# Patient Record
Sex: Female | Born: 1960 | Hispanic: Yes | Marital: Married | Smoking: Former smoker
Health system: Southern US, Community
[De-identification: ages and names within clinical notes are randomized; demographics above are authoritative.]

## PROBLEM LIST (undated history)

## (undated) DIAGNOSIS — K219 Gastro-esophageal reflux disease without esophagitis: Secondary | ICD-10-CM

## (undated) DIAGNOSIS — T7840XA Allergy, unspecified, initial encounter: Secondary | ICD-10-CM

## (undated) DIAGNOSIS — E079 Disorder of thyroid, unspecified: Secondary | ICD-10-CM

## (undated) DIAGNOSIS — E785 Hyperlipidemia, unspecified: Secondary | ICD-10-CM

## (undated) DIAGNOSIS — E119 Type 2 diabetes mellitus without complications: Secondary | ICD-10-CM

## (undated) HISTORY — DX: Hyperlipidemia, unspecified: E78.5

## (undated) HISTORY — PX: FOOT SURGERY: SHX648

## (undated) HISTORY — DX: Allergy, unspecified, initial encounter: T78.40XA

## (undated) HISTORY — DX: Type 2 diabetes mellitus without complications: E11.9

## (undated) HISTORY — PX: REDUCTION MAMMAPLASTY: SUR839

## (undated) HISTORY — PX: SHOULDER SURGERY: SHX246

## (undated) HISTORY — DX: Gastro-esophageal reflux disease without esophagitis: K21.9

---

## 2008-12-20 ENCOUNTER — Encounter: Admission: RE | Admit: 2008-12-20 | Discharge: 2008-12-20 | Payer: Self-pay | Admitting: Family Medicine

## 2010-09-13 ENCOUNTER — Other Ambulatory Visit: Payer: Self-pay | Admitting: Orthopedic Surgery

## 2010-09-13 ENCOUNTER — Encounter (HOSPITAL_COMMUNITY): Payer: Managed Care, Other (non HMO)

## 2010-09-13 LAB — COMPREHENSIVE METABOLIC PANEL
AST: 17 U/L (ref 0–37)
Albumin: 3.9 g/dL (ref 3.5–5.2)
Alkaline Phosphatase: 91 U/L (ref 39–117)
BUN: 16 mg/dL (ref 6–23)
Potassium: 3.7 mEq/L (ref 3.5–5.1)
Sodium: 140 mEq/L (ref 135–145)
Total Protein: 7.5 g/dL (ref 6.0–8.3)

## 2010-09-13 LAB — URINALYSIS, ROUTINE W REFLEX MICROSCOPIC
Leukocytes, UA: NEGATIVE
Nitrite: NEGATIVE
Specific Gravity, Urine: 1.025 (ref 1.005–1.030)
Urobilinogen, UA: 1 mg/dL (ref 0.0–1.0)
pH: 6.5 (ref 5.0–8.0)

## 2010-09-13 LAB — DIFFERENTIAL
Eosinophils Absolute: 0.1 10*3/uL (ref 0.0–0.7)
Lymphocytes Relative: 31 % (ref 12–46)
Lymphs Abs: 1.8 10*3/uL (ref 0.7–4.0)
Neutro Abs: 3.4 10*3/uL (ref 1.7–7.7)
Neutrophils Relative %: 60 % (ref 43–77)

## 2010-09-13 LAB — SURGICAL PCR SCREEN: MRSA, PCR: NEGATIVE

## 2010-09-13 LAB — APTT: aPTT: 31 seconds (ref 24–37)

## 2010-09-13 LAB — CBC
Hemoglobin: 12.8 g/dL (ref 12.0–15.0)
Platelets: 291 10*3/uL (ref 150–400)
RBC: 4.56 MIL/uL (ref 3.87–5.11)
WBC: 5.7 10*3/uL (ref 4.0–10.5)

## 2010-09-13 LAB — PROTIME-INR
INR: 1.06 (ref 0.00–1.49)
Prothrombin Time: 14 seconds (ref 11.6–15.2)

## 2010-09-20 ENCOUNTER — Ambulatory Visit (HOSPITAL_COMMUNITY)
Admission: RE | Admit: 2010-09-20 | Discharge: 2010-09-21 | Disposition: A | Discharge status: Home / Self Care | Payer: Managed Care, Other (non HMO) | Source: Ambulatory Visit | Attending: Orthopedic Surgery | Admitting: Orthopedic Surgery

## 2010-09-20 DIAGNOSIS — M25519 Pain in unspecified shoulder: Secondary | ICD-10-CM | POA: Insufficient documentation

## 2010-09-20 DIAGNOSIS — Z01812 Encounter for preprocedural laboratory examination: Secondary | ICD-10-CM | POA: Insufficient documentation

## 2010-09-20 DIAGNOSIS — M25819 Other specified joint disorders, unspecified shoulder: Secondary | ICD-10-CM | POA: Insufficient documentation

## 2010-09-20 DIAGNOSIS — S43429A Sprain of unspecified rotator cuff capsule, initial encounter: Secondary | ICD-10-CM | POA: Insufficient documentation

## 2010-09-20 DIAGNOSIS — E039 Hypothyroidism, unspecified: Secondary | ICD-10-CM | POA: Insufficient documentation

## 2010-09-20 DIAGNOSIS — X58XXXA Exposure to other specified factors, initial encounter: Secondary | ICD-10-CM | POA: Insufficient documentation

## 2010-09-20 DIAGNOSIS — Z79899 Other long term (current) drug therapy: Secondary | ICD-10-CM | POA: Insufficient documentation

## 2010-09-24 NOTE — Discharge Summary (Signed)
  NAMEHARUNA, [name] NO.:  [id number]  MEDICAL RECORD NO.:  [id number]  LOCATION:  1605                         FACILITY:  [facility]  PHYSICIAN:  Georges Lynch. Moshe Wenger, M.D.DATE OF BIRTH:  04/30/1961  DATE OF ADMISSION:  09/20/2010 DATE OF DISCHARGE:  09/21/2010                              DISCHARGE SUMMARY   She was taken to surgery by me on September 20, 2010.  I repaired a complete complex tear of the right rotator cuff.  Postop, she did well.  I saw her today for followup.  On September 21, 2010, she was afebrile.  She had good circulation and had no specific problems.  Her laboratory studies on admission, her screening of Staph test was negative.  White count 5700, hemoglobin 12.8, hematocrit 39, and her platelets were 291,000. The C-metabolic was normal except for the glucose was slightly elevated at 102.  The INR of 1.06, PT was 14, and PTT was 31.  Urinalysis was negative.  DISCHARGE CONDITION:  Improved.  DISCHARGE MEDICINES:  On the discharge manager.  DISCHARGE DIET:  Regular diet.  DISCHARGE INSTRUCTIONS: 1. To see me in the office in 2 weeks or prior to that if there is any     problem. 2. She will keep her arm in a sling as I explained. 3. She can change her dressings daily.          ______________________________ Georges Lynch Darrelyn Hillock, M.D.     RAG/MEDQ  D:  09/21/2010  T:  09/21/2010  Job:  [id number]  Electronically Signed by Ranee Gosselin M.D. on 09/24/2010 08:25:32 AM

## 2010-09-24 NOTE — Op Note (Signed)
  NAMEDACEY, MILBERGER NO.:  [id number]  MEDICAL RECORD NO.:  [id number]  LOCATION:  1605                         FACILITY:  [facility]  PHYSICIAN:  Georges Lynch. Shamara Soza, M.D.DATE OF BIRTH:  04/30/1961  DATE OF PROCEDURE:  09/20/2010 DATE OF DISCHARGE:                              OPERATIVE REPORT   SURGEON:  Georges Lynch. Darrelyn Hillock, MD  ASSISTANT:  Jaquelyn Bitter. Chabon, PA  PREOPERATIVE DIAGNOSES: 1. Complex retracted tear of the right rotator cuff. 2. Impingement syndrome, right shoulder.  POSTOPERATIVE DIAGNOSES: 1. Complex retracted tear of the right rotator cuff. 2. Impingement syndrome, right shoulder.  OPERATION: 1. Open acromionectomy on the right shoulder. 2. Repair of a complex retracted tear of the rotator cuff, right     shoulder.  PROCEDURE:  Under general anesthesia, routine orthopedic prepping and draping was carried out.  The patient had 1 g of IV Ancef preop.  The appropriate time-out was carried out before an incision was made. Prior to that the patient's appropriate right arm was marked in the holding area.  She did have an interscalene block prior to bringing her back to surgery.  After the patient has placed in the beach-chair position, sterile prepping and draping of right shoulder was carried out.  As I mentioned, she had the appropriate time-out.  Incision was made over the anterior aspect of the right shoulder, bleeders identified, and cauterized.  At this time, identified the acromion and stripped the deltoid tendon from the acromion in usual fashion.  I then did a partial acromionectomy.  At that time, we had nice re-establishment of the subacromial space.  I excised the subdeltoid bursa and inspected the tendon, she had a retracted tear of the rotator cuff, as a complex tear. I thoroughly irrigated out the area.  I burred the lateral articular surface of the humerus and then utilized 2 Stryker anchors and then sutured the tendon down in  place.  I thoroughly irrigated out the area, no graft was used.  I then reapproximated deltoid tendon and muscle in usual fashion.  Subcutaneous was closed with 0 Vicryl and skin with metal staples.  Sterile Neosporin dressing was applied.  The patient was placed in a shoulder immobilizer.         ______________________________ Georges Lynch Darrelyn Hillock, M.D.    RAG/MEDQ  D:  09/20/2010  T:  09/21/2010  Job:  [id number]  Electronically Signed by Ranee Gosselin M.D. on 09/24/2010 08:25:30 AM

## 2011-02-07 ENCOUNTER — Other Ambulatory Visit: Payer: Self-pay | Admitting: Family Medicine

## 2011-02-07 DIAGNOSIS — Z1231 Encounter for screening mammogram for malignant neoplasm of breast: Secondary | ICD-10-CM

## 2011-02-25 ENCOUNTER — Ambulatory Visit
Admission: RE | Admit: 2011-02-25 | Discharge: 2011-02-25 | Disposition: A | Discharge status: Home / Self Care | Payer: Managed Care, Other (non HMO) | Source: Ambulatory Visit | Attending: Family Medicine | Admitting: Family Medicine

## 2011-02-25 DIAGNOSIS — Z1231 Encounter for screening mammogram for malignant neoplasm of breast: Secondary | ICD-10-CM

## 2012-09-27 ENCOUNTER — Emergency Department (HOSPITAL_COMMUNITY)
Admission: EM | Admit: 2012-09-27 | Discharge: 2012-09-27 | Disposition: A | Discharge status: Home / Self Care | Payer: BC Managed Care – PPO | Attending: Emergency Medicine | Admitting: Emergency Medicine

## 2012-09-27 ENCOUNTER — Emergency Department (HOSPITAL_COMMUNITY): Payer: BC Managed Care – PPO

## 2012-09-27 ENCOUNTER — Encounter (HOSPITAL_COMMUNITY): Payer: Self-pay | Admitting: *Deleted

## 2012-09-27 DIAGNOSIS — R112 Nausea with vomiting, unspecified: Secondary | ICD-10-CM | POA: Insufficient documentation

## 2012-09-27 DIAGNOSIS — Z79899 Other long term (current) drug therapy: Secondary | ICD-10-CM | POA: Insufficient documentation

## 2012-09-27 DIAGNOSIS — R0789 Other chest pain: Secondary | ICD-10-CM | POA: Insufficient documentation

## 2012-09-27 DIAGNOSIS — R209 Unspecified disturbances of skin sensation: Secondary | ICD-10-CM | POA: Insufficient documentation

## 2012-09-27 DIAGNOSIS — E079 Disorder of thyroid, unspecified: Secondary | ICD-10-CM | POA: Insufficient documentation

## 2012-09-27 HISTORY — DX: Disorder of thyroid, unspecified: E07.9

## 2012-09-27 LAB — CBC
HCT: 42.7 % (ref 36.0–46.0)
Hemoglobin: 14.6 g/dL (ref 12.0–15.0)
MCH: 29.2 pg (ref 26.0–34.0)
RBC: 5 MIL/uL (ref 3.87–5.11)

## 2012-09-27 LAB — BASIC METABOLIC PANEL
CO2: 29 mEq/L (ref 19–32)
Calcium: 9.7 mg/dL (ref 8.4–10.5)
Glucose, Bld: 117 mg/dL — ABNORMAL HIGH (ref 70–99)
Potassium: 3.6 mEq/L (ref 3.5–5.1)
Sodium: 139 mEq/L (ref 135–145)

## 2012-09-27 LAB — POCT I-STAT TROPONIN I: Troponin i, poc: 0 ng/mL (ref 0.00–0.08)

## 2012-09-27 NOTE — ED Notes (Signed)
Reports having palpitations and left side chest pain since yesterday. Having nausea. No acute distress noted at triage, ekg done.

## 2012-09-27 NOTE — ED Notes (Signed)
PA at bedside.

## 2012-09-27 NOTE — ED Provider Notes (Signed)
CSN: [id number]     Arrival date & time 09/27/12  1123 History     First MD Initiated Contact with Patient 09/27/12 1306     Chief Complaint  Patient presents with  . Chest Pain   (Consider location/radiation/quality/duration/timing/severity/associated sxs/prior Treatment) HPI Comments: Patient is a 52 year old female who presents for chest pain with onset yesterday. Patient states the chest pain is left-sided and was sharp in nature yesterday. Pain lasted most of the day before spontaneously resolving yesterday evening. Patient admits to some residual left-sided chest soreness. Pain is nonradiating and without modifying factors. She admits to associated nausea as well as one episode of NB/NB emesis yesterday. Patient further admits to a tingling sensation in her left arm. She denies associated fever, cough, hemoptysis, jaw pain, SOB, abdominal pain, and extremity numbness or weakness.  Patient denies known history of ACS, CAD, PE, DVT, and tobacco use. She endorses a family history of ACS and her mother at the age of 6.  Patient is a 52 y.o. female presenting with chest pain. The history is provided by the patient. No language interpreter was used.  Chest Pain Associated symptoms: nausea and vomiting   Associated symptoms: no abdominal pain, no cough, no dysphagia, no fever, no numbness, no shortness of breath and no weakness     Past Medical History  Diagnosis Date  . Thyroid disease    History reviewed. No pertinent past surgical history. History reviewed. No pertinent family history. History  Substance Use Topics  . Smoking status: Never Smoker   . Smokeless tobacco: Not on file  . Alcohol Use: No   OB History   Grav Para Term Preterm Abortions TAB SAB Ect Mult Living                 Review of Systems  Constitutional: Negative for fever.  HENT: Negative for trouble swallowing.   Eyes: Negative for visual disturbance.  Respiratory: Negative for cough and shortness of  breath.   Cardiovascular: Positive for chest pain.  Gastrointestinal: Positive for nausea and vomiting. Negative for abdominal pain.  Neurological: Negative for syncope, weakness and numbness.  All other systems reviewed and are negative.   Allergies  Review of patient's allergies indicates no known allergies.  Home Medications   Current Outpatient Rx  Name  Route  Sig  Dispense  Refill  . levothyroxine (SYNTHROID, LEVOTHROID) 100 MCG tablet   Oral   Take 100 mcg by mouth daily before breakfast.          BP 146/76  Pulse 59  Temp(Src) 98.3 F (36.8 C) (Oral)  Resp 14  SpO2 99%  Physical Exam  Nursing note and vitals reviewed. Constitutional: She is oriented to person, place, and time. She appears well-developed and well-nourished. No distress.  HENT:  Head: Normocephalic and atraumatic.  Mouth/Throat: Oropharynx is clear and moist. No oropharyngeal exudate.  Eyes: Conjunctivae and EOM are normal. No scleral icterus.  Neck: Normal range of motion.  Cardiovascular: Normal rate, regular rhythm, normal heart sounds and intact distal pulses.   Pulmonary/Chest: Effort normal and breath sounds normal. No respiratory distress. She has no wheezes. She has no rales. She exhibits no tenderness.  Abdominal: Soft. She exhibits no distension. There is no tenderness. There is no rebound.  Musculoskeletal: Normal range of motion.  Neurological: She is alert and oriented to person, place, and time.  Skin: Skin is warm and dry. No rash noted. She is not diaphoretic. No erythema. No pallor.  Psychiatric: She  has a normal mood and affect. Her behavior is normal.   ED Course   Procedures (including critical care time)  Labs Reviewed  BASIC METABOLIC PANEL - Abnormal; Notable for the following:    Glucose, Bld 117 (*)    GFR calc non Af Amer 83 (*)    All other components within normal limits  CBC  POCT I-STAT TROPONIN I  POCT I-STAT TROPONIN I    Date: 09/27/2012  Rate: 65   Rhythm: normal sinus rhythm  QRS Axis: normal  Intervals: normal  ST/T Wave abnormalities: normal  Conduction Disutrbances:none  Narrative Interpretation: NSR; no STEMI or ischemic change  Old EKG Reviewed: none available I have personally reviewed and interpreted this EKG  Dg Chest 2 View  09/27/2012   *RADIOLOGY REPORT*  Clinical Data: Chest pain  CHEST - 2 VIEW  Comparison: None.  Findings: The heart size and vascular pattern are normal.  The lungs are clear.  IMPRESSION: No significant abnormalities.   Original Report Authenticated By: Esperanza Heir, M.D.   1. Atypical chest pain    MDM  Patient is a 52 year old female who presents for chest pain with onset yesterday. Patient well and nontoxic appearing on initial presentation and hemodynamically stable. Labs without evidence of leukocytosis, anemia, or electrolyte imbalance. Kidney function preserved. Chest x-ray, by my interpretation, shows no evidence of pneumonia, pneumothorax, pleural effusion, or other acute cardiopulmonary abnormality. EKG without evidence of STEMI or ischemic change. Troponin x2 within normal limits. Given atypical nature of symptoms, patient's hemostability, and reassuring workup, doubt ACS. Also doubt pulmonary embolism given lack of tachycardia, tachypnea, dyspnea, or hypoxia; patient's Well's Score 0 correlating with 1.3% risk of PE. Patient appropriate for discharge with primary care and cardiology followup for further evaluation of symptoms. Have discussed with patient the recommendation for stress testing as an outpatient. Indications for ED return discussed and patient agreeable to plan.     Antony Madura, PA-C 09/27/12 [phone]

## 2012-09-29 NOTE — ED Provider Notes (Signed)
Medical screening examination/treatment/procedure(s) were performed by non-physician practitioner and as supervising physician I was immediately available for consultation/collaboration.    Kayzlee Wirtanen Joseph Shavontae Gibeault, MD 09/29/12 0706 

## 2014-05-31 ENCOUNTER — Other Ambulatory Visit: Payer: Self-pay | Admitting: Internal Medicine

## 2014-05-31 DIAGNOSIS — Z1231 Encounter for screening mammogram for malignant neoplasm of breast: Secondary | ICD-10-CM

## 2014-05-31 DIAGNOSIS — R5381 Other malaise: Secondary | ICD-10-CM

## 2014-08-03 ENCOUNTER — Other Ambulatory Visit: Payer: Self-pay | Admitting: Internal Medicine

## 2014-08-03 DIAGNOSIS — Z780 Asymptomatic menopausal state: Secondary | ICD-10-CM

## 2015-02-16 ENCOUNTER — Ambulatory Visit (INDEPENDENT_AMBULATORY_CARE_PROVIDER_SITE_OTHER): Payer: 59 | Admitting: Family Medicine

## 2015-02-16 VITALS — BP 122/80 | HR 96 | Temp 99.7°F | Resp 16 | Ht 65.0 in | Wt 195.0 lb

## 2015-02-16 DIAGNOSIS — R05 Cough: Secondary | ICD-10-CM | POA: Diagnosis not present

## 2015-02-16 DIAGNOSIS — R0981 Nasal congestion: Secondary | ICD-10-CM

## 2015-02-16 DIAGNOSIS — R6889 Other general symptoms and signs: Secondary | ICD-10-CM

## 2015-02-16 DIAGNOSIS — R059 Cough, unspecified: Secondary | ICD-10-CM

## 2015-02-16 DIAGNOSIS — R509 Fever, unspecified: Secondary | ICD-10-CM | POA: Diagnosis not present

## 2015-02-16 LAB — POCT INFLUENZA A/B
INFLUENZA A, POC: NEGATIVE
Influenza B, POC: NEGATIVE

## 2015-02-16 MED ORDER — BENZONATATE 100 MG PO CAPS: 100.0000 mg | ORAL_CAPSULE | Freq: Three times a day (TID) | ORAL | Status: DC | PRN

## 2015-02-16 MED ORDER — HYDROCODONE-HOMATROPINE 5-1.5 MG/5ML PO SYRP: 5.0000 mL | ORAL_SOLUTION | ORAL | Status: DC | PRN

## 2015-02-16 NOTE — Patient Instructions (Signed)
Drink plenty of fluids and get enough rest.  Stay off work through tomorrow.  Use Hycodan cough syrup 1 teaspoon every 4-6 hours as needed for cough.  When you return to work you can take the benzonatate cough pills one or 2 pills 3 times daily as needed for daytime cough. These do not work as well, but they do not cause drowsiness.  Take Tylenol 500 mg 2 tablets 3 times daily as needed for fever and aching.  If further symptoms can also take ibuprofen 200 mg 3 pills 3 times daily  Return at anytime if not improving or if getting worse

## 2015-02-16 NOTE — Progress Notes (Signed)
Patient ID: [place] Mizrahi, female    DOB: 04/30/1961  Age: 55 y.o. MRN: [id number]  Chief Complaint  Patient presents with  . Cough    x 2 days  . chest congestion  . Nasal Congestion  . Sore Throat  . Headache    Subjective:   55 year old lady who has been sick for 2 days with a respiratory tract infection. She has had fever and chills and body ache and headache. She is coughing a lot. She is blowing stuff or nose. She does not smoke. She has vomited. She is just generally ill. She did have a flu shot this fall. 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  Current allergies, medications, problem list, past/family and social histories reviewed.  Objective:  BP 122/80 mmHg  Pulse 96  Temp(Src) 99.7 F (37.6 C) (Oral)  Resp 16  Ht 5\' 5"  (1.651 m)  Wt 195 lb (88.451 kg)  BMI 32.45 kg/m2  SpO2 97%  Pleasant lady, alert and oriented, ill-appearing, frequently coughing. Febrile to touch. Her TMs are normal. Throat not erythematous. Neck supple without significant nodes. Chest is clear to auscultation. For the coughing is noted. Heart regular, slightly tachycardic, without murmur.  Results for orders placed or performed in visit on 02/16/15  POCT Influenza A/B  Result Value Ref Range   Influenza A, POC Negative Negative   Influenza B, POC Negative Negative     Assessment & Plan:   Assessment: 1. Flu-like symptoms   2. Cough   3. Head congestion   4. Fever and chills       Plan: Flulike illness. Even though she has had the flu shot will check a swab to make sure she doesn't need antiviral medication.  With flu test negative will treat symptomatically  Orders Placed This Encounter  Procedures  . POCT Influenza A/B    Meds ordered this encounter  Medications  . HYDROcodone-homatropine (HYCODAN) 5-1.5 MG/5ML syrup    Sig: Take 5 mLs by mouth every 4 (four) hours as needed.    Dispense:  120 mL     Refill:  0  . benzonatate (TESSALON) 100 MG capsule    Sig: Take 1-2 capsules (100-200 mg total) by mouth 3 (three) times daily as needed for cough.    Dispense:  40 capsule    Refill:  0         Patient Instructions  Drink plenty of fluids and get enough rest.  Stay off work through tomorrow.  Use Hycodan cough syrup 1 teaspoon every 4-6 hours as needed for cough.  When you return to work you can take the benzonatate cough pills one or 2 pills 3 times daily as needed for daytime cough. These do not work as well, but they do not cause drowsiness.  Take Tylenol 500 mg 2 tablets 3 times daily as needed for fever and aching.  If further symptoms can also take ibuprofen 200 mg 3 pills 3 times daily  Return at anytime if not improving or if getting worse     Return if symptoms worsen or fail to improve.   Jamarien Rodkey, MD 02/16/2015

## 2015-03-23 ENCOUNTER — Ambulatory Visit (INDEPENDENT_AMBULATORY_CARE_PROVIDER_SITE_OTHER): Payer: 59

## 2015-03-23 ENCOUNTER — Ambulatory Visit (INDEPENDENT_AMBULATORY_CARE_PROVIDER_SITE_OTHER): Payer: 59 | Admitting: Urgent Care

## 2015-03-23 VITALS — BP 124/86 | HR 69 | Temp 97.6°F | Resp 18 | Ht 63.78 in | Wt 199.0 lb

## 2015-03-23 DIAGNOSIS — M5136 Other intervertebral disc degeneration, lumbar region: Secondary | ICD-10-CM | POA: Diagnosis not present

## 2015-03-23 DIAGNOSIS — K5900 Constipation, unspecified: Secondary | ICD-10-CM

## 2015-03-23 DIAGNOSIS — M25551 Pain in right hip: Secondary | ICD-10-CM

## 2015-03-23 DIAGNOSIS — N393 Stress incontinence (female) (male): Secondary | ICD-10-CM | POA: Diagnosis not present

## 2015-03-23 DIAGNOSIS — M545 Low back pain: Secondary | ICD-10-CM

## 2015-03-23 DIAGNOSIS — M25552 Pain in left hip: Secondary | ICD-10-CM

## 2015-03-23 DIAGNOSIS — R32 Unspecified urinary incontinence: Secondary | ICD-10-CM

## 2015-03-23 MED ORDER — POLYETHYLENE GLYCOL 3350 17 GM/SCOOP PO POWD: 17.0000 g | Freq: Every day | ORAL | Status: DC | PRN

## 2015-03-23 MED ORDER — MELOXICAM 7.5 MG PO TABS: 7.5000 mg | ORAL_TABLET | Freq: Every day | ORAL | Status: DC

## 2015-03-23 MED ORDER — CYCLOBENZAPRINE HCL 5 MG PO TABS: 5.0000 mg | ORAL_TABLET | Freq: Every day | ORAL | Status: DC

## 2015-03-23 NOTE — Patient Instructions (Addendum)
Because you received an x-ray today, you will receive an invoice from [facility]. Please contact [facility] at [phone] with questions or concerns regarding your invoice. Our billing staff will not be able to assist you with those questions.    Discopata degenerativa (Degenerative Disk Disease) La discopata degenerativa es una enfermedad causada por los cambios que se producen en los discos intervertebrales a medida que una persona envejece. Los discos intervertebrales son blandos y comprimibles, y se encuentran entre los huesos de la columna (vrtebras). Estos discos actan como amortiguadores de los [place]. La discopata degenerativa puede comprometer a toda la columna vertebral. Sin embargo, el cuello y parte inferior de la espalda son las zonas ms comnmente afectadas. Al envejecer, pueden producirse muchos cambios en los discos intervertebrales, por ejemplo:  Pueden secarse y encogerse.  Pueden formarse pequeos desgarros en el recubrimiento exterior duro del disco (anillo).  El espacio intervertebral puede reducirse debido a la prdida de [place].  Pueden desarrollarse crecimientos anormales en el hueso (espolones). Esto puede ejercer presin [employer] races nerviosas que salen del canal espinal y Engineer, production.  El canal espinal puede estrecharse. FACTORES DE RIESGO   Tener sobrepeso.  Tener antecedentes familiares de discopata degenerativa.  Fumar.  El riesgo es mayor si suele levantar objetos pesados o sufre una lesin repentina. [place] persona a otra y pueden incluir los siguientes:  Dolor de intensidad variable. Algunas personas no tienen dolor, mientras que otras sufren un dolor intenso. La ubicacin del dolor depende de la zona de la columna vertebral que est afectada.  Si se trata de un disco que se encuentra cerca de la zona del cuello, tendr dolor de cuello o del brazo.  Si la zona afectada es la  parte inferior de la espalda, tendr dolor de [place], de los glteos o las piernas.  Dolor que empeora al agacharse, [employer] brazos o Optometrist movimientos de torsin.  Dolor que puede comenzar de [place] gradual y Film/video editor con el paso del [place]. Tambin puede aparecer despus de sufrir una lesin leve o importante.  Hormigueo o adormecimiento de los brazos o las piernas. DIAGNSTICO  El mdico le preguntar cules son sus sntomas y Estate manager/land agent las actividades y los hbitos que pueden causar Conservation officer, historic buildings. [place], [employer] y las enfermedades que tuvo o los tratamientos que recibi. El [employer] har un examen para determinar el rango de movimiento posible de la zona afectada, controlar la fuerza que tiene en las extremidades, as Therapist, occupational sensibilidad en las zonas de los brazos y las piernas inervadas por diferentes races nerviosas. Tambin se Educational psychologist los siguientes estudios:   Una radiografa de la columna.  Otros estudios por imgenes, como una RM. TRATAMIENTO  El Sport and exercise psychologist cul es el mejor plan de tratamiento. El tratamiento puede incluir lo siguiente:  Medicamentos.  Ejercicios de rehabilitacin. INSTRUCCIONES PARA EL CUIDADO EN EL HOGAR   Siga las tcnicas adecuadas para caminar y Lexicographer objetos pesados como se lo haya recomendado el mdico.  Mantenga una buena [place].  Haga ejercicios regularmente como se lo haya recomendado el [place] de relajacin.  Cambie los hbitos para sentarse, ponerse de pie y dormir como se lo haya recomendado el mdico.  Cambie frecuentemente de posicin.  Mantenga un peso saludable o baje de peso como se lo haya recomendado el mdico.  No consuma ningn producto que contenga tabaco, lo que incluye cigarrillos, tabaco de Higher education careers adviser  o cigarrillos electrnicos. Si necesita ayuda para dejar de fumar, consulte al mdico.  Use calzado que tenga el soporte correcto.  Tome los medicamentos solamente  como se lo haya indicado el mdico. SOLICITE ATENCIN MDICA SI:   El dolor no desaparece en el trmino de 1 a 4semanas.  Pierde drsticamente de peso o el apetito. SOLICITE ATENCIN MDICA DE INMEDIATO SI:   El dolor es intenso.  Tiene [employer], las manos o las piernas.  Comienza a perder el control de la vejiga o los intestinos.  Tiene fiebre o sudores nocturnos. ASEGRESE DE QUE:   Comprende estas instrucciones.  Controlar su afeccin.  Recibir ayuda de inmediato si no mejora o si empeora.   Esta informacin no tiene Marine scientist el consejo del mdico. Asegrese de hacerle al mdico cualquier pregunta que tenga.   Document Released: 05/16/2008 Document Revised: 02/18/2014 Elsevier Interactive Patient Education 2016 [place] (Constipation, Adult) Estreimiento significa que una persona tiene menos de tres evacuaciones en una semana, dificultad para defecar, o que las heces son secas, duras, o ms grandes que lo normal. A medida que envejecemos el estreimiento es ms comn. Una dieta baja en fibra, no tomar suficientes lquidos y el uso de ciertos medicamentos pueden Agricultural engineer.  CAUSAS   Ciertos medicamentos, como los antidepresivos, analgsicos, suplementos de hierro, anticidos y diurticos.  Algunas enfermedades, como la diabetes, el sndrome del colon irritable, enfermedad de la tiroides, o depresin.  No beber suficiente agua.  No consumir suficientes alimentos ricos en fibra.  Situaciones de estrs o viajes.  Falta de actividad fsica o de ejercicio.  Ignorar la necesidad sbita de Landscape architect.  Uso en exceso de laxantes. SIGNOS Y SNTOMAS   Defecar menos de tres veces por semana.  Dificultad para defecar.  Tener las heces secas y duras, o ms grandes que las normales.  Sensacin de estar lleno o hinchado.  Dolor en la parte baja del abdomen.  No sentir alivio despus de  defecar. DIAGNSTICO  El mdico le har una historia clnica y un examen fsico. Pueden hacerle exmenes adicionales para el estreimiento grave. Estos estudios pueden ser:  Un radiografa con enema de bario para examinar el recto, el colon y, en algunos casos, el intestino delgado.  Una sigmoidoscopia para examinar el colon inferior.  Una colonoscopia para examinar todo el colon. TRATAMIENTO  El tratamiento depender de la gravedad del estreimiento y de la causa. Algunos tratamientos nutricionales son beber ms lquidos y comer ms alimentos ricos en fibra. El cambio en el estilo de vida incluye hacer ejercicios de [place] regular. Si estas recomendaciones para Animator dieta y en el estilo de vida no ayudan, el mdico le puede indicar el uso de laxantes de venta libre para ayudarlo a Landscape architect. Los medicamentos recetados se pueden prescribir si los medicamentos de venta libre no lo [place].  INSTRUCCIONES PARA EL CUIDADO EN EL HOGAR   Consuma alimentos con alto contenido de [place], como frutas, vegetales, cereales integrales y porotos.  Limite los alimentos procesados ricos en grasas y azcar, como las papas fritas, hamburguesas, galletas, dulces y refrescos.  Puede agregar un suplemento de fibra a su dieta si no obtiene lo suficiente de los alimentos.  Beba suficiente lquido para Consulting civil engineer orina clara o de color amarillo plido.  Haga ejercicio regularmente o segn las indicaciones del mdico.  Vaya al bao cuando sienta la necesidad de ir. No se aguante las ganas.  Tome solo medicamentos de venta libre o recetados, segn las indicaciones del mdico. No tome otros medicamentos para el estreimiento sin consultarlo antes con su mdico. SOLICITE ATENCIN MDICA DE INMEDIATO SI:   Observa sangre brillante en las heces.  El estreimiento dura ms de 4 das o [place].  Siente dolor abdominal o rectal.  Las heces son delgadas como un lpiz.  Pierde peso de [place]  inexplicable. ASEGRESE DE QUE:   Comprende estas instrucciones.  Controlar su afeccin.  Recibir ayuda de inmediato si no mejora o si empeora.   Esta informacin no tiene Marine scientist el consejo del mdico. Asegrese de hacerle al mdico cualquier pregunta que tenga.   Document Released: 02/17/2007 Document Revised: 02/18/2014 Elsevier Interactive Patient Education [employer].

## 2015-03-23 NOTE — Progress Notes (Signed)
MRN: [id number] DOB: 04/30/1961  Subjective:   Quanetta Schlau is a 55 y.o. female presenting for chief complaint of Back Pain  Back pain - Reports ~3 week history of worsening low back and pelvic pain. The pain is achy, constant but becomes intermittently severe sharp pain, occasionally radiates down her left leg like a muscle cramp. Her pain is worse with bending, sitting for prolonged periods of time. She has not tried any medications for pain relief. Denies fever, night sweats, weight loss, n/v, abdominal pain. Patient has bowel movements every other day. Patient admits an unhealthy diet.  Incontinence - She admits ~6 year history of urinary incontinence, has been seeing an urologist, underwent therapy, medical therapy. Started having this issue after having her children. She was recommended to have surgery but patient stopped following up.  Metztli has a current medication list which includes the following prescription(s): levothyroxine. Also has No Known Allergies.  Judea  has a past medical history of Thyroid disease. Also  has no past surgical history on file.  Objective:   Vitals: BP 124/86 mmHg  Pulse 69  Temp(Src) 97.6 F (36.4 C) (Oral)  Resp 18  Ht 5' 3.78" (1.62 m)  Wt 199 lb (90.266 kg)  BMI 34.39 kg/m2  SpO2 97%  Physical Exam  Constitutional: She is oriented to person, place, and time. She appears well-developed and well-nourished.  Cardiovascular: Normal rate, regular rhythm and intact distal pulses.  Exam reveals no gallop and no friction rub.   No murmur heard. Pulmonary/Chest: No respiratory distress. She has no wheezes. She has no rales.  Abdominal: Soft. Bowel sounds are normal. She exhibits no distension and no mass. There is tenderness (generalized throughout).  Musculoskeletal:       Lumbar back: She exhibits decreased range of motion (extension and flexion), tenderness (over areas outlined) and spasm (lumbar region). She exhibits no bony  tenderness, no swelling, no edema, no deformity and no laceration.       Back:  Neurological: She is alert and oriented to person, place, and time. She has normal reflexes.  Skin: Skin is warm and dry.   Dg Lumbar Spine Complete  03/23/2015  CLINICAL DATA:  Worsening low back pain.  Pelvic pain. EXAM: LUMBAR SPINE - COMPLETE 4+ VIEW COMPARISON:  03/23/2015. FINDINGS: Degenerative changes lumbar spine. No acute bony abnormality identified. No evidence of fracture or dislocation. Normal alignment. Pelvic calcifications noted most consistent phleboliths. Distal ureteral stones on the right cannot be excluded. IMPRESSION: Diffuse multilevel degenerative change.  No acute bony abnormality. Electronically Signed   By: Marcello Moores  Register   On: 03/23/2015 10:48   Dg Abd 1 View  03/23/2015  CLINICAL DATA:  Constipation. EXAM: ABDOMEN - 1 VIEW COMPARISON:  None. FINDINGS: Soft tissue structures are unremarkable. Moderate amount of stool noted throughout the colon. No acute bony abnormality identified. Pelvic calcifications noted on the right. These are most likely phleboliths. Distal ureteral stones cannot be excluded . IMPRESSION: Moderate volume of stool noted throughout the colon. Constipation cannot be excluded. No bowel distention. Electronically Signed   By: Marcello Moores  Register   On: 03/23/2015 10:46   Dg Hips Bilat W Or W/o Pelvis 2v  03/23/2015  CLINICAL DATA:  Bilateral hip pain.  No known injury . EXAM: DG HIP (WITH OR WITHOUT PELVIS) 2V BILAT COMPARISON:  No prior. FINDINGS: Degenerative changes lumbar spine and both hips. No acute bony or joint abnormality identified. Calcific densities in the right lower pelvis most likely phleboliths, distal ureteral  stones cannot be excluded. Calcific densities in the right buttock, most likely injection granuloma P IMPRESSION: No acute abnormality identified. Degenerative changes lumbar spine and both hips. Electronically Signed   By: Marcello Moores  Register   On: 03/23/2015  10:49   Assessment and Plan :   1. Degenerative disc disease, lumbar 2. Midline low back pain, with sciatica presence unspecified 3. Bilateral hip pain - Counseled patient on diagnosis, she agreed to start Meloxicam once daily for pain, consider short steroid course if patient is not diabetic and her back pain is not improving in 2 weeks. Patient agreed.  4. Constipation, unspecified constipation type - Constipation may be worsening back pain from DDD. Recommended dietary modifications, use Miralax as needed.   5. Incontinence in female - Ambulatory referral to Urology for consult on surgical intervention.   Jaynee Eagles, PA-C [facility] [phone] 03/23/2015 9:52 AM

## 2015-10-17 ENCOUNTER — Ambulatory Visit (INDEPENDENT_AMBULATORY_CARE_PROVIDER_SITE_OTHER): Payer: 59 | Admitting: Family Medicine

## 2015-10-17 VITALS — BP 122/72 | HR 72 | Temp 98.4°F | Resp 17 | Ht 65.0 in | Wt 196.0 lb

## 2015-10-17 DIAGNOSIS — M545 Low back pain, unspecified: Secondary | ICD-10-CM

## 2015-10-17 DIAGNOSIS — R5383 Other fatigue: Secondary | ICD-10-CM | POA: Diagnosis not present

## 2015-10-17 DIAGNOSIS — Z23 Encounter for immunization: Secondary | ICD-10-CM

## 2015-10-17 DIAGNOSIS — E039 Hypothyroidism, unspecified: Secondary | ICD-10-CM | POA: Diagnosis not present

## 2015-10-17 DIAGNOSIS — Z13220 Encounter for screening for lipoid disorders: Secondary | ICD-10-CM

## 2015-10-17 LAB — CBC WITH DIFFERENTIAL/PLATELET
BASOS PCT: 0 %
Basophils Absolute: 0 cells/uL (ref 0–200)
EOS ABS: 138 {cells}/uL (ref 15–500)
EOS PCT: 2 %
HCT: 42.8 % (ref 35.0–45.0)
Hemoglobin: 14.5 g/dL (ref 11.7–15.5)
LYMPHS PCT: 31 %
Lymphs Abs: 2139 cells/uL (ref 850–3900)
MCH: 29.3 pg (ref 27.0–33.0)
MCHC: 33.9 g/dL (ref 32.0–36.0)
MCV: 86.5 fL (ref 80.0–100.0)
MONOS PCT: 4 %
MPV: 10.2 fL (ref 7.5–12.5)
Monocytes Absolute: 276 cells/uL (ref 200–950)
Neutro Abs: 4347 cells/uL (ref 1500–7800)
Neutrophils Relative %: 63 %
PLATELETS: 270 10*3/uL (ref 140–400)
RBC: 4.95 MIL/uL (ref 3.80–5.10)
RDW: 13.7 % (ref 11.0–15.0)
WBC: 6.9 10*3/uL (ref 3.8–10.8)

## 2015-10-17 MED ORDER — MELOXICAM 7.5 MG PO TABS: 7.5000 mg | ORAL_TABLET | Freq: Every day | ORAL | 0 refills | Status: DC

## 2015-10-17 MED ORDER — LEVOTHYROXINE SODIUM 100 MCG PO TABS: 100.0000 ug | ORAL_TABLET | Freq: Every day | ORAL | 5 refills | Status: DC

## 2015-10-17 NOTE — Progress Notes (Signed)
By signing my name below I, Tereasa Coop, attest that this documentation has been prepared under the direction and in the presence of Wendie Agreste, MD. Electonically Signed. Tereasa Coop, Scribe 10/17/2015 at 12:21 PM  Subjective:    Patient ID: Sheri West, female    DOB: 04/30/1961, 55 y.o.   MRN: [id number]  Chief Complaint  Patient presents with  . Other    thyroid medication, cholestrol   . Back Pain  . Immunizations    flu     HPI Sheri West is a 55 y.o. female who presents to the [facility] complaining of low back pain for years. Pt seen by Rosario Adie Mar 23, 2015 with 3 week history of low back pain. 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 Treated with meloxicam once daily. Meloxicam provided mild relief. Back pain is in the rt lumbar region. Pt states pain is worsened at work where she does a lot of lifting. Pt denies any urinary or bowel incontinence, saddle anesthesia, or lower extremity weakness or numbness. Pt denies any radiation of back pain.   Pt also requesting thyroid medication refill. Pt on synthroid 100 MCG. Last TSH is not on file. Pt reports having generalized fatigue for the past several months. Pt states she has been followed by a different urgent care in the past.  Pt is requesting cholesterol medication refill. No lipid panel seen on file.  Pt also requesting flu shot.   Pt last ate yesterday.   There are no active problems to display for this patient.  Past Medical History:  Diagnosis Date  . Thyroid disease    No past surgical history on file. No Known Allergies Prior to Admission medications   Medication Sig Start Date End Date Taking? Authorizing Provider  levothyroxine (SYNTHROID, LEVOTHROID) 100 MCG tablet Take 100 mcg by mouth daily before breakfast.   Yes Historical Provider, MD  cyclobenzaprine (FLEXERIL) 5 MG tablet Take 1-2 tablets (5-10 mg  total) by mouth at bedtime. Patient not taking: Reported on 10/17/2015 03/23/15   Jaynee Eagles, PA-C  meloxicam (MOBIC) 7.5 MG tablet Take 1 tablet (7.5 mg total) by mouth daily. Patient not taking: Reported on 10/17/2015 03/23/15   Jaynee Eagles, PA-C  polyethylene glycol powder (GLYCOLAX/MIRALAX) powder Take 17 g by mouth daily as needed. Patient not taking: Reported on 10/17/2015 03/23/15   Jaynee Eagles, PA-C   Social History   Social History  . Marital status: Married    Spouse name: N/A  . Number of children: N/A  . Years of education: N/A   Occupational History  . Not on file.   Social History Main Topics  . Smoking status: Never Smoker  . Smokeless tobacco: Never Used  . Alcohol use No  . Drug use: No  . Sexual activity: Not on file   Other Topics Concern  . Not on file   Social History Narrative  . No narrative on file       Review of Systems  Constitutional: Positive for fever. Negative for fatigue and unexpected weight change.  Respiratory: Negative for chest tightness and shortness of breath.   Cardiovascular: Negative for chest pain, palpitations and leg swelling.  Gastrointestinal: Negative for abdominal pain and blood in stool.  Neurological: Negative for dizziness, syncope, light-headedness and headaches.       Objective:   Physical Exam  Constitutional: She is oriented to person, place, and time. She appears well-developed  and well-nourished.  HENT:  Head: Normocephalic and atraumatic.  Eyes: Conjunctivae and EOM are normal. Pupils are equal, round, and reactive to light.  Neck: Carotid bruit is not present. No thyromegaly present.  Cardiovascular: Normal rate, regular rhythm, normal heart sounds and intact distal pulses.   Pulmonary/Chest: Effort normal and breath sounds normal.  Abdominal: Soft. She exhibits no pulsatile midline mass. There is no tenderness.  Musculoskeletal:  Pt has forward back flexion to 80 degrees. Pt has full and equal back rotation. Pt has  mild paralumbar tenderness on the rt side with no appreciable muscle spasm. There is no midline bony tenderness.   Lymphadenopathy:    She has no cervical adenopathy.  Neurological: She is alert and oriented to person, place, and time.  Reflex Scores:      Patellar reflexes are 1+ on the right side and 1+ on the left side.      Achilles reflexes are 1+ on the right side and 1+ on the left side. Negative seated straight leg raise. Pt is able to heel and toe walk.   Skin: Skin is warm and dry.  Psychiatric: She has a normal mood and affect. Her behavior is normal.  Vitals reviewed.    Vitals:   10/17/15 1012  BP: 122/72  Pulse: 72  Resp: 17  Temp: 98.4 F (36.9 C)  TempSrc: Oral  SpO2: 97%  Weight: 196 lb (88.9 kg)  Height: 5\' 5"  (1.651 m)         Assessment & Plan:    Sheri West is a 54 y.o. female Right-sided low back pain without sciatica - Plan: meloxicam (MOBIC) 7.5 MG tablet   - Recurrent low back pain with acute flare.  -Start meloxicam 7.5 mg daily, home exercises and symptomatic care discussed, recheck in the next 3-4 weeks if not improved for possible ortho eval or physical therapy. Sooner if worse.  Need for prophylactic vaccination and inoculation against influenza - Plan: Flu Vaccine QUAD 36+ mos IM  Other fatigue - Plan: COMPLETE METABOLIC PANEL WITH GFR, CBC with Differential/Platelet  -CMP, CBC, TSH ordered. Follow-up to discuss this further once these labs are back.  Hypothyroidism, unspecified hypothyroidism type - Plan: TSH, levothyroxine (SYNTHROID, LEVOTHROID) 100 MCG tablet  -check tsh, refilled same dose synthroid for now. rtc to discuss fatigue further when labs are back.   Screening for hyperlipidemia - Plan: Lipid panel   Meds ordered this encounter  Medications  . levothyroxine (SYNTHROID, LEVOTHROID) 100 MCG tablet    Sig: Take 1 tablet (100 mcg total) by mouth daily before breakfast.    Dispense:  30 tablet    Refill:  5  .  meloxicam (MOBIC) 7.5 MG tablet    Sig: Take 1 tablet (7.5 mg total) by mouth daily.    Dispense:  30 tablet    Refill:  0   Patient Instructions   For your back pain, restart meloxicam once per day. See information below on back pain as well as some exercises you can try throughout the day. Recheck in the next 3-4 weeks and at that time if you're not improving, I can refer you to an orthopedist. Sooner if you would like.  I will check your thyroid level as well as other electrolytes with fatigue, I refilled your Synthroid at same dose, follow-up in 3-4 weeks to discuss the symptoms further.  I did check a screening lipid or cholesterol test, and can discuss results next visit if needed.   Return to the  clinic or go to the nearest emergency room if any of your symptoms worsen or new symptoms occur.    Low Back Strain With Rehab A strain is an injury in which a tendon or muscle is torn. The muscles and tendons of the lower back are vulnerable to strains. However, these muscles and tendons are very strong and require a great force to be injured. Strains are classified into three categories. Grade 1 strains cause pain, but the tendon is not lengthened. Grade 2 strains include a lengthened ligament, due to the ligament being stretched or partially ruptured. With grade 2 strains there is still function, although the function may be decreased. Grade 3 strains involve a complete tear of the tendon or muscle, and function is usually impaired. SYMPTOMS   Pain in the lower back.  Pain that affects one side more than the other.  Pain that gets worse with movement and may be felt in the hip, buttocks, or back of the thigh.  Muscle spasms of the muscles in the back.  Swelling along the muscles of the back.  Loss of strength of the back muscles.  Crackling sound (crepitation) when the muscles are touched. CAUSES  Lower back strains occur when a force is placed on the muscles or tendons that is  greater than they can handle. Common causes of injury include:  Prolonged overuse of the muscle-tendon units in the lower back, usually from incorrect posture.  A single violent injury or force applied to the back. RISK INCREASES WITH:  Sports that involve twisting forces on the spine or a lot of bending at the waist (football, rugby, weightlifting, bowling, golf, tennis, speed skating, racquetball, swimming, running, gymnastics, diving).  Poor strength and flexibility.  Failure to warm up properly before activity.  Family history of lower back pain or disk disorders.  Previous back injury or surgery (especially fusion).  Poor posture with lifting, especially heavy objects.  Prolonged sitting, especially with poor posture. PREVENTION   Learn and use proper posture when sitting or lifting (maintain proper posture when sitting, lift using the knees and legs, not at the waist).  Warm up and stretch properly before activity.  Allow for adequate recovery between workouts.  Maintain physical fitness:  Strength, flexibility, and endurance.  Cardiovascular fitness. PROGNOSIS  If treated properly, lower back strains usually heal within 6 weeks. RELATED COMPLICATIONS   Recurring symptoms, resulting in a chronic problem.  Chronic inflammation, scarring, and partial muscle-tendon tear.  Delayed healing or resolution of symptoms.  Prolonged disability. TREATMENT  Treatment first involves the use of ice and medicine, to reduce pain and inflammation. The use of strengthening and stretching exercises may help reduce pain with activity. These exercises may be performed at home or with a therapist. Severe injuries may require referral to a therapist for further evaluation and treatment, such as ultrasound. Your caregiver may advise that you wear a back brace or corset, to help reduce pain and discomfort. Often, prolonged bed rest results in greater harm then benefit. Corticosteroid  injections may be recommended. However, these should be reserved for the most serious cases. It is important to avoid using your back when lifting objects. At night, sleep on your back on a firm mattress with a pillow placed under your knees. If non-surgical treatment is unsuccessful, surgery may be needed.  MEDICATION   If pain medicine is needed, nonsteroidal anti-inflammatory medicines (aspirin and ibuprofen), or other minor pain relievers (acetaminophen), are often advised.  Do not take pain medicine  for 7 days before surgery.  Prescription pain relievers may be given, if your caregiver thinks they are needed. Use only as directed and only as much as you need.  Ointments applied to the skin may be helpful.  Corticosteroid injections may be given by your caregiver. These injections should be reserved for the most serious cases, because they may only be given a certain number of times. HEAT AND COLD  Cold treatment (icing) should be applied for 10 to 15 minutes every 2 to 3 hours for inflammation and pain, and immediately after activity that aggravates your symptoms. Use ice packs or an ice massage.  Heat treatment may be used before performing stretching and strengthening activities prescribed by your caregiver, physical therapist, or athletic trainer. Use a heat pack or a warm water soak. SEEK MEDICAL CARE IF:   Symptoms get worse or do not improve in 2 to 4 weeks, despite treatment.  You develop numbness, weakness, or loss of bowel or bladder function.  New, unexplained symptoms develop. (Drugs used in treatment may produce side effects.) EXERCISES  RANGE OF MOTION (ROM) AND STRETCHING EXERCISES - Low Back Strain Most people with lower back pain will find that their symptoms get worse with excessive bending forward (flexion) or arching at the lower back (extension). The exercises which will help resolve your symptoms will focus on the opposite motion.  Your physician, physical  therapist or athletic trainer will help you determine which exercises will be most helpful to resolve your lower back pain. Do not complete any exercises without first consulting with your caregiver. Discontinue any exercises which make your symptoms worse until you speak to your caregiver.  If you have pain, numbness or tingling which travels down into your buttocks, leg or foot, the goal of the therapy is for these symptoms to move closer to your back and eventually resolve. Sometimes, these leg symptoms will get better, but your lower back pain may worsen. This is typically an indication of progress in your rehabilitation. Be very alert to any changes in your symptoms and the activities in which you participated in the 24 hours prior to the change. Sharing this information with your caregiver will allow him/her to most efficiently treat your condition.  These exercises may help you when beginning to rehabilitate your injury. Your symptoms may resolve with or without further involvement from your physician, physical therapist or athletic trainer. While completing these exercises, remember:  Restoring tissue flexibility helps normal motion to return to the joints. This allows healthier, less painful movement and activity.  An effective stretch should be held for at least 30 seconds.  A stretch should never be painful. You should only feel a gentle lengthening or release in the stretched tissue. FLEXION RANGE OF MOTION AND STRETCHING EXERCISES: STRETCH - Flexion, Single Knee to Chest   Lie on a firm bed or floor with both legs extended in front of you.  Keeping one leg in contact with the floor, bring your opposite knee to your chest. Hold your leg in place by either grabbing behind your thigh or at your knee.  Pull until you feel a gentle stretch in your lower back. Hold __________ seconds.  Slowly release your grasp and repeat the exercise with the opposite side. Repeat __________ times.  Complete this exercise __________ times per day.  STRETCH - Flexion, Double Knee to Chest   Lie on a firm bed or floor with both legs extended in front of you.  Keeping one leg in  contact with the floor, bring your opposite knee to your chest.  Tense your stomach muscles to support your back and then lift your other knee to your chest. Hold your legs in place by either grabbing behind your thighs or at your knees.  Pull both knees toward your chest until you feel a gentle stretch in your lower back. Hold __________ seconds.  Tense your stomach muscles and slowly return one leg at a time to the floor. Repeat __________ times. Complete this exercise __________ times per day.  STRETCH - Low Trunk Rotation  Lie on a firm bed or floor. Keeping your legs in front of you, bend your knees so they are both pointed toward the ceiling and your feet are flat on the floor.  Extend your arms out to the side. This will stabilize your upper body by keeping your shoulders in contact with the floor.  Gently and slowly drop both knees together to one side until you feel a gentle stretch in your lower back. Hold for __________ seconds.  Tense your stomach muscles to support your lower back as you bring your knees back to the starting position. Repeat the exercise to the other side. Repeat __________ times. Complete this exercise __________ times per day  EXTENSION RANGE OF MOTION AND FLEXIBILITY EXERCISES: STRETCH - Extension, Prone on Elbows   Lie on your stomach on the floor, a bed will be too soft. Place your palms about shoulder width apart and at the height of your head.  Place your elbows under your shoulders. If this is too painful, stack pillows under your chest.  Allow your body to relax so that your hips drop lower and make contact more completely with the floor.  Hold this position for __________ seconds.  Slowly return to lying flat on the floor. Repeat __________ times. Complete this  exercise __________ times per day.  RANGE OF MOTION - Extension, Prone Press Ups  Lie on your stomach on the floor, a bed will be too soft. Place your palms about shoulder width apart and at the height of your head.  Keeping your back as relaxed as possible, slowly straighten your elbows while keeping your hips on the floor. You may adjust the placement of your hands to maximize your comfort. As you gain motion, your hands will come more underneath your shoulders.  Hold this position __________ seconds.  Slowly return to lying flat on the floor. Repeat __________ times. Complete this exercise __________ times per day.  RANGE OF MOTION- Quadruped, Neutral Spine   Assume a hands and knees position on a firm surface. Keep your hands under your shoulders and your knees under your hips. You may place padding under your knees for comfort.  Drop your head and point your tail bone toward the ground below you. This will round out your lower back like an angry cat. Hold this position for __________ seconds.  Slowly lift your head and release your tail bone so that your back sags into a large arch, like an old horse.  Hold this position for __________ seconds.  Repeat this until you feel limber in your lower back.  Now, find your "sweet spot." This will be the most comfortable position somewhere between the two previous positions. This is your neutral spine. Once you have found this position, tense your stomach muscles to support your lower back.  Hold this position for __________ seconds. Repeat __________ times. Complete this exercise __________ times per day.  STRENGTHENING EXERCISES - Low  Back Strain These exercises may help you when beginning to rehabilitate your injury. These exercises should be done near your "sweet spot." This is the neutral, low-back arch, somewhere between fully rounded and fully arched, that is your least painful position. When performed in this safe range of motion,  these exercises can be used for people who have either a flexion or extension based injury. These exercises may resolve your symptoms with or without further involvement from your physician, physical therapist or athletic trainer. While completing these exercises, remember:   Muscles can gain both the endurance and the strength needed for everyday activities through controlled exercises.  Complete these exercises as instructed by your physician, physical therapist or athletic trainer. Increase the resistance and repetitions only as guided.  You may experience muscle soreness or fatigue, but the pain or discomfort you are trying to eliminate should never worsen during these exercises. If this pain does worsen, stop and make certain you are following the directions exactly. If the pain is still present after adjustments, discontinue the exercise until you can discuss the trouble with your caregiver. STRENGTHENING - Deep Abdominals, Pelvic Tilt  Lie on a firm bed or floor. Keeping your legs in front of you, bend your knees so they are both pointed toward the ceiling and your feet are flat on the floor.  Tense your lower abdominal muscles to press your lower back into the floor. This motion will rotate your pelvis so that your tail bone is scooping upwards rather than pointing at your feet or into the floor.  With a gentle tension and even breathing, hold this position for __________ seconds. Repeat __________ times. Complete this exercise __________ times per day.  STRENGTHENING - Abdominals, Crunches   Lie on a firm bed or floor. Keeping your legs in front of you, bend your knees so they are both pointed toward the ceiling and your feet are flat on the floor. Cross your arms over your chest.  Slightly tip your chin down without bending your neck.  Tense your abdominals and slowly lift your trunk high enough to just clear your shoulder blades. Lifting higher can put excessive stress on the lower  back and does not further strengthen your abdominal muscles.  Control your return to the starting position. Repeat __________ times. Complete this exercise __________ times per day.  STRENGTHENING - Quadruped, Opposite UE/LE Lift   Assume a hands and knees position on a firm surface. Keep your hands under your shoulders and your knees under your hips. You may place padding under your knees for comfort.  Find your neutral spine and gently tense your abdominal muscles so that you can maintain this position. Your shoulders and hips should form a rectangle that is parallel with the floor and is not twisted.  Keeping your trunk steady, lift your right hand no higher than your shoulder and then your left leg no higher than your hip. Make sure you are not holding your breath. Hold this position __________ seconds.  Continuing to keep your abdominal muscles tense and your back steady, slowly return to your starting position. Repeat with the opposite arm and leg. Repeat __________ times. Complete this exercise __________ times per day.  STRENGTHENING - Lower Abdominals, Double Knee Lift  Lie on a firm bed or floor. Keeping your legs in front of you, bend your knees so they are both pointed toward the ceiling and your feet are flat on the floor.  Tense your abdominal muscles to brace your lower  back and slowly lift both of your knees until they come over your hips. Be certain not to hold your breath.  Hold __________ seconds. Using your abdominal muscles, return to the starting position in a slow and controlled manner. Repeat __________ times. Complete this exercise __________ times per day.  POSTURE AND BODY MECHANICS CONSIDERATIONS - Low Back Strain Keeping correct posture when sitting, standing or completing your activities will reduce the stress put on different body tissues, allowing injured tissues a chance to heal and limiting painful experiences. The following are general guidelines for improved  posture. Your physician or physical therapist will provide you with any instructions specific to your needs. While reading these guidelines, remember:  The exercises prescribed by your provider will help you have the flexibility and strength to maintain correct postures.  The correct posture provides the best environment for your joints to work. All of your joints have less wear and tear when properly supported by a spine with good posture. This means you will experience a healthier, less painful body.  Correct posture must be practiced with all of your activities, especially prolonged sitting and standing. Correct posture is as important when doing repetitive low-stress activities (typing) as it is when doing a single heavy-load activity (lifting). RESTING POSITIONS Consider which positions are most painful for you when choosing a resting position. If you have pain with flexion-based activities (sitting, bending, stooping, squatting), choose a position that allows you to rest in a less flexed posture. You would want to avoid curling into a fetal position on your side. If your pain worsens with extension-based activities (prolonged standing, working overhead), avoid resting in an extended position such as sleeping on your stomach. Most people will find more comfort when they rest with their spine in a more neutral position, neither too rounded nor too arched. Lying on a non-sagging bed on your side with a pillow between your knees, or on your back with a pillow under your knees will often provide some relief. Keep in mind, being in any one position for a prolonged period of time, no matter how correct your posture, can still lead to stiffness. PROPER SITTING POSTURE In order to minimize stress and discomfort on your spine, you must sit with correct posture. Sitting with good posture should be effortless for a healthy body. Returning to good posture is a gradual process. Many people can work toward this  most comfortably by using various supports until they have the flexibility and strength to maintain this posture on their own. When sitting with proper posture, your ears will fall over your shoulders and your shoulders will fall over your hips. You should use the back of the chair to support your upper back. Your lower back will be in a neutral position, just slightly arched. You may place a small pillow or folded towel at the base of your lower back for support.  When working at a desk, create an environment that supports good, upright posture. Without extra support, muscles tire, which leads to excessive strain on joints and other tissues. Keep these recommendations in mind: CHAIR:  A chair should be able to slide under your desk when your back makes contact with the back of the chair. This allows you to work closely.  The chair's height should allow your eyes to be level with the upper part of your monitor and your hands to be slightly lower than your elbows. BODY POSITION  Your feet should make contact with the floor. If this  is not possible, use a foot rest.  Keep your ears over your shoulders. This will reduce stress on your neck and lower back. INCORRECT SITTING POSTURES  If you are feeling tired and unable to assume a healthy sitting posture, do not slouch or slump. This puts excessive strain on your back tissues, causing more damage and pain. Healthier options include:  Using more support, like a lumbar pillow.  Switching tasks to something that requires you to be upright or walking.  Talking a brief walk.  Lying down to rest in a neutral-spine position. PROLONGED STANDING WHILE SLIGHTLY LEANING FORWARD  When completing a task that requires you to lean forward while standing in one place for a long time, place either foot up on a stationary 2-4 inch high object to help maintain the best posture. When both feet are on the ground, the lower back tends to lose its slight inward curve.  If this curve flattens (or becomes too large), then the back and your other joints will experience too much stress, tire more quickly, and can cause pain. CORRECT STANDING POSTURES Proper standing posture should be assumed with all daily activities, even if they only take a few moments, like when brushing your teeth. As in sitting, your ears should fall over your shoulders and your shoulders should fall over your hips. You should keep a slight tension in your abdominal muscles to brace your spine. Your tailbone should point down to the ground, not behind your body, resulting in an over-extended swayback posture.  INCORRECT STANDING POSTURES  Common incorrect standing postures include a forward head, locked knees and/or an excessive swayback. WALKING Walk with an upright posture. Your ears, shoulders and hips should all line-up. PROLONGED ACTIVITY IN A FLEXED POSITION When completing a task that requires you to bend forward at your waist or lean over a low surface, try to find a way to stabilize 3 out of 4 of your limbs. You can place a hand or elbow on your thigh or rest a knee on the surface you are reaching across. This will provide you more stability so that your muscles do not fatigue as quickly. By keeping your knees relaxed, or slightly bent, you will also reduce stress across your lower back. CORRECT LIFTING TECHNIQUES DO :   Assume a wide stance. This will provide you more stability and the opportunity to get as close as possible to the object which you are lifting.  Tense your abdominals to brace your spine. Bend at the knees and hips. Keeping your back locked in a neutral-spine position, lift using your leg muscles. Lift with your legs, keeping your back straight.  Test the weight of unknown objects before attempting to lift them.  Try to keep your elbows locked down at your sides in order get the best strength from your shoulders when carrying an object.  Always ask for help when  lifting heavy or awkward objects. INCORRECT LIFTING TECHNIQUES DO NOT:   Lock your knees when lifting, even if it is a small object.  Bend and twist. Pivot at your feet or move your feet when needing to change directions.  Assume that you can safely pick up even a paper clip without proper posture.   This information is not intended to replace advice given to you by your health care provider. Make sure you discuss any questions you have with your health care provider.   Document Released: 01/28/2005 Document Revised: 02/18/2014 Document Reviewed: 05/12/2008 Elsevier Interactive Patient Education 2016  [employer]   Back Pain, Adult Back pain is very common in adults.The cause of back pain is rarely dangerous and the pain often gets better over time.The cause of your back pain may not be known. Some common causes of back pain include:  Strain of the muscles or ligaments supporting the spine.  Wear and tear (degeneration) of the spinal disks.  Arthritis.  Direct injury to the back. For many people, back pain may return. Since back pain is rarely dangerous, most people can learn to manage this condition on their own. HOME CARE INSTRUCTIONS Watch your back pain for any changes. The following actions may help to lessen any discomfort you are feeling:  Remain active. It is stressful on your back to sit or stand in one place for long periods of time. Do not sit, drive, or stand in one place for more than 30 minutes at a time. Take short walks on even surfaces as soon as you are able.Try to increase the length of time you walk each day.  Exercise regularly as directed by your health care provider. Exercise helps your back heal faster. It also helps avoid future injury by keeping your muscles strong and flexible.  Do not stay in bed.Resting more than 1-2 days can delay your recovery.  Pay attention to your body when you bend and lift. The most comfortable positions are those that put  less stress on your recovering back. Always use proper lifting techniques, including:  Bending your knees.  Keeping the load close to your body.  Avoiding twisting.  Find a comfortable position to sleep. Use a firm mattress and lie on your side with your knees slightly bent. If you lie on your back, put a pillow under your knees.  Avoid feeling anxious or stressed.Stress increases muscle tension and can worsen back pain.It is important to recognize when you are anxious or stressed and learn ways to manage it, such as with exercise.  Take medicines only as directed by your health care provider. Over-the-counter medicines to reduce pain and inflammation are often the most helpful.Your health care provider may prescribe muscle relaxant drugs.These medicines help dull your pain so you can more quickly return to your normal activities and healthy exercise.  Apply ice to the injured area:  Put ice in a plastic bag.  Place a towel between your skin and the bag.  Leave the ice on for 20 minutes, 2-3 times a day for the first 2-3 days. After that, ice and heat may be alternated to reduce pain and spasms.  Maintain a healthy weight. Excess weight puts extra stress on your back and makes it difficult to maintain good posture. SEEK MEDICAL CARE IF:  You have pain that is not relieved with rest or medicine.  You have increasing pain going down into the legs or buttocks.  You have pain that does not improve in one week.  You have night pain.  You lose weight.  You have a fever or chills. SEEK IMMEDIATE MEDICAL CARE IF:   You develop new bowel or bladder control problems.  You have unusual weakness or numbness in your arms or legs.  You develop nausea or vomiting.  You develop abdominal pain.  You feel faint.   This information is not intended to replace advice given to you by your health care provider. Make sure you discuss any questions you have with your health care  provider.   Document Released: 01/28/2005 Document Revised: 02/18/2014 Document Reviewed: 06/01/2013 Elsevier  Interactive Patient Education [employer]. Fatigue Fatigue is feeling tired all of the time, a lack of energy, or a lack of motivation. Occasional or mild fatigue is often a normal response to activity or life in general. However, long-lasting (chronic) or extreme fatigue may indicate an underlying medical condition. HOME CARE INSTRUCTIONS  Watch your fatigue for any changes. The following actions may help to lessen any discomfort you are feeling:  Talk to your health care provider about how much sleep you need each night. Try to get the required amount every night.  Take medicines only as directed by your health care provider.  Eat a healthy and nutritious diet. Ask your health care provider if you need help changing your diet.  Drink enough fluid to keep your urine clear or pale yellow.  Practice ways of relaxing, such as yoga, meditation, massage therapy, or acupuncture.  Exercise regularly.   Change situations that cause you stress. Try to keep your work and personal routine reasonable.  Do not abuse illegal drugs.  Limit alcohol intake to no more than 1 drink per day for nonpregnant women and 2 drinks per day for men. One drink equals 12 ounces of beer, 5 ounces of wine, or 1 ounces of hard liquor.  Take a multivitamin, if directed by your health care provider. SEEK MEDICAL CARE IF:   Your fatigue does not get better.  You have a fever.   You have unintentional weight loss or gain.  You have headaches.   You have difficulty:   Falling asleep.  Sleeping throughout the night.  You feel angry, guilty, anxious, or sad.   You are unable to have a bowel movement (constipation).   You skin is dry.   Your legs or another part of your body is swollen.  SEEK IMMEDIATE MEDICAL CARE IF:   You feel confused.   Your vision is blurry.  You  feel faint or pass out.   You have a severe headache.   You have severe abdominal, pelvic, or back pain.   You have chest pain, shortness of breath, or an irregular or fast heartbeat.   You are unable to urinate or you urinate less than normal.   You develop abnormal bleeding, such as bleeding from the rectum, vagina, nose, lungs, or nipples.  You vomit blood.   You have thoughts about harming yourself or committing suicide.   You are worried that you might harm someone else.    This information is not intended to replace advice given to you by your health care provider. Make sure you discuss any questions you have with your health care provider.   Document Released: 11/25/2006 Document Revised: 02/18/2014 Document Reviewed: 06/01/2013 Elsevier Interactive Patient Education 2016 [employer].      IF you received an x-ray today, you will receive an invoice from [facility] Radiology. Please contact [facility] at [phone] with questions or concerns regarding your invoice.   IF you received labwork today, you will receive an invoice from [employer]. Please contact [facility] at [phone] with questions or concerns regarding your invoice.   Our billing staff will not be able to assist you with questions regarding bills from these companies.  You will be contacted with the lab results as soon as they are available. The fastest way to get your results is to activate your My Chart account. Instructions are located on the last page of this paperwork. If you have not heard from [place] regarding the results  in 2 weeks, please contact this office.    We recommend that you schedule a mammogram for breast cancer screening. Typically, you do not need a referral to do this. Please contact a local imaging center to schedule your mammogram.  [facility] - [phone]  *ask for the Radiology [place] [facility] - [phone] or [phone]  [facility] - [phone] [place] [phone] [facility] - [phone]  *ask for the [facility] - [phone]  *ask for the Radiology Department [facility] - [phone]  *ask for the [place] - [phone]     I personally performed the services described in this documentation, which was scribed in my presence. The recorded information has been reviewed and considered, and addended by me as needed.   Signed,   Merri Ray, MD [facility].  10/18/15 10:40 AM

## 2015-10-17 NOTE — Patient Instructions (Addendum)
For your back pain, restart meloxicam once per day. See information below on back pain as well as some exercises you can try throughout the day. Recheck in the next 3-4 weeks and at that time if you're not improving, I can refer you to an orthopedist. Sooner if you would like.  I will check your thyroid level as well as other electrolytes with fatigue, I refilled your Synthroid at same dose, follow-up in 3-4 weeks to discuss the symptoms further.  I did check a screening lipid or cholesterol test, and can discuss results next visit if needed.   Return to the clinic or go to the nearest emergency room if any of your symptoms worsen or new symptoms occur.    Low Back Strain With Rehab A strain is an injury in which a tendon or muscle is torn. The muscles and tendons of the lower back are vulnerable to strains. However, these muscles and tendons are very strong and require a great force to be injured. Strains are classified into three categories. Grade 1 strains cause pain, but the tendon is not lengthened. Grade 2 strains include a lengthened ligament, due to the ligament being stretched or partially ruptured. With grade 2 strains there is still function, although the function may be decreased. Grade 3 strains involve a complete tear of the tendon or muscle, and function is usually impaired. SYMPTOMS   Pain in the lower back.  Pain that affects one side more than the other.  Pain that gets worse with movement and may be felt in the hip, buttocks, or back of the thigh.  Muscle spasms of the muscles in the back.  Swelling along the muscles of the back.  Loss of strength of the back muscles.  Crackling sound (crepitation) when the muscles are touched. CAUSES  Lower back strains occur when a force is placed on the muscles or tendons that is greater than they can handle. Common causes of injury include:  Prolonged overuse of the muscle-tendon units in the lower back, usually from incorrect  posture.  A single violent injury or force applied to the back. RISK INCREASES WITH:  Sports that involve twisting forces on the spine or a lot of bending at the waist (football, rugby, weightlifting, bowling, golf, tennis, speed skating, racquetball, swimming, running, gymnastics, diving).  Poor strength and flexibility.  Failure to warm up properly before activity.  Family history of lower back pain or disk disorders.  Previous back injury or surgery (especially fusion).  Poor posture with lifting, especially heavy objects.  Prolonged sitting, especially with poor posture. PREVENTION   Learn and use proper posture when sitting or lifting (maintain proper posture when sitting, lift using the knees and legs, not at the waist).  Warm up and stretch properly before activity.  Allow for adequate recovery between workouts.  Maintain physical fitness:  Strength, flexibility, and endurance.  Cardiovascular fitness. PROGNOSIS  If treated properly, lower back strains usually heal within 6 weeks. RELATED COMPLICATIONS   Recurring symptoms, resulting in a chronic problem.  Chronic inflammation, scarring, and partial muscle-tendon tear.  Delayed healing or resolution of symptoms.  Prolonged disability. TREATMENT  Treatment first involves the use of ice and medicine, to reduce pain and inflammation. The use of strengthening and stretching exercises may help reduce pain with activity. These exercises may be performed at home or with a therapist. Severe injuries may require referral to a therapist for further evaluation and treatment, such as ultrasound. Your caregiver may advise that you  wear a back brace or corset, to help reduce pain and discomfort. Often, prolonged bed rest results in greater harm then benefit. Corticosteroid injections may be recommended. However, these should be reserved for the most serious cases. It is important to avoid using your back when lifting objects. At  night, sleep on your back on a firm mattress with a pillow placed under your knees. If non-surgical treatment is unsuccessful, surgery may be needed.  MEDICATION   If pain medicine is needed, nonsteroidal anti-inflammatory medicines (aspirin and ibuprofen), or other minor pain relievers (acetaminophen), are often advised.  Do not take pain medicine for 7 days before surgery.  Prescription pain relievers may be given, if your caregiver thinks they are needed. Use only as directed and only as much as you need.  Ointments applied to the skin may be helpful.  Corticosteroid injections may be given by your caregiver. These injections should be reserved for the most serious cases, because they may only be given a certain number of times. HEAT AND COLD  Cold treatment (icing) should be applied for 10 to 15 minutes every 2 to 3 hours for inflammation and pain, and immediately after activity that aggravates your symptoms. Use ice packs or an ice massage.  Heat treatment may be used before performing stretching and strengthening activities prescribed by your caregiver, physical therapist, or athletic trainer. Use a heat pack or a warm water soak. SEEK MEDICAL CARE IF:   Symptoms get worse or do not improve in 2 to 4 weeks, despite treatment.  You develop numbness, weakness, or loss of bowel or bladder function.  New, unexplained symptoms develop. (Drugs used in treatment may produce side effects.) EXERCISES  RANGE OF MOTION (ROM) AND STRETCHING EXERCISES - Low Back Strain Most people with lower back pain will find that their symptoms get worse with excessive bending forward (flexion) or arching at the lower back (extension). The exercises which will help resolve your symptoms will focus on the opposite motion.  Your physician, physical therapist or athletic trainer will help you determine which exercises will be most helpful to resolve your lower back pain. Do not complete any exercises without  first consulting with your caregiver. Discontinue any exercises which make your symptoms worse until you speak to your caregiver.  If you have pain, numbness or tingling which travels down into your buttocks, leg or foot, the goal of the therapy is for these symptoms to move closer to your back and eventually resolve. Sometimes, these leg symptoms will get better, but your lower back pain may worsen. This is typically an indication of progress in your rehabilitation. Be very alert to any changes in your symptoms and the activities in which you participated in the 24 hours prior to the change. Sharing this information with your caregiver will allow him/her to most efficiently treat your condition.  These exercises may help you when beginning to rehabilitate your injury. Your symptoms may resolve with or without further involvement from your physician, physical therapist or athletic trainer. While completing these exercises, remember:  Restoring tissue flexibility helps normal motion to return to the joints. This allows healthier, less painful movement and activity.  An effective stretch should be held for at least 30 seconds.  A stretch should never be painful. You should only feel a gentle lengthening or release in the stretched tissue. FLEXION RANGE OF MOTION AND STRETCHING EXERCISES: STRETCH - Flexion, Single Knee to Chest   Lie on a firm bed or floor with both legs  extended in front of you.  Keeping one leg in contact with the floor, bring your opposite knee to your chest. Hold your leg in place by either grabbing behind your thigh or at your knee.  Pull until you feel a gentle stretch in your lower back. Hold __________ seconds.  Slowly release your grasp and repeat the exercise with the opposite side. Repeat __________ times. Complete this exercise __________ times per day.  STRETCH - Flexion, Double Knee to Chest   Lie on a firm bed or floor with both legs extended in front of  you.  Keeping one leg in contact with the floor, bring your opposite knee to your chest.  Tense your stomach muscles to support your back and then lift your other knee to your chest. Hold your legs in place by either grabbing behind your thighs or at your knees.  Pull both knees toward your chest until you feel a gentle stretch in your lower back. Hold __________ seconds.  Tense your stomach muscles and slowly return one leg at a time to the floor. Repeat __________ times. Complete this exercise __________ times per day.  STRETCH - Low Trunk Rotation  Lie on a firm bed or floor. Keeping your legs in front of you, bend your knees so they are both pointed toward the ceiling and your feet are flat on the floor.  Extend your arms out to the side. This will stabilize your upper body by keeping your shoulders in contact with the floor.  Gently and slowly drop both knees together to one side until you feel a gentle stretch in your lower back. Hold for __________ seconds.  Tense your stomach muscles to support your lower back as you bring your knees back to the starting position. Repeat the exercise to the other side. Repeat __________ times. Complete this exercise __________ times per day  EXTENSION RANGE OF MOTION AND FLEXIBILITY EXERCISES: STRETCH - Extension, Prone on Elbows   Lie on your stomach on the floor, a bed will be too soft. Place your palms about shoulder width apart and at the height of your head.  Place your elbows under your shoulders. If this is too painful, stack pillows under your chest.  Allow your body to relax so that your hips drop lower and make contact more completely with the floor.  Hold this position for __________ seconds.  Slowly return to lying flat on the floor. Repeat __________ times. Complete this exercise __________ times per day.  RANGE OF MOTION - Extension, Prone Press Ups  Lie on your stomach on the floor, a bed will be too soft. Place your palms  about shoulder width apart and at the height of your head.  Keeping your back as relaxed as possible, slowly straighten your elbows while keeping your hips on the floor. You may adjust the placement of your hands to maximize your comfort. As you gain motion, your hands will come more underneath your shoulders.  Hold this position __________ seconds.  Slowly return to lying flat on the floor. Repeat __________ times. Complete this exercise __________ times per day.  RANGE OF MOTION- Quadruped, Neutral Spine   Assume a hands and knees position on a firm surface. Keep your hands under your shoulders and your knees under your hips. You may place padding under your knees for comfort.  Drop your head and point your tail bone toward the ground below you. This will round out your lower back like an angry cat. Hold this position  for __________ seconds.  Slowly lift your head and release your tail bone so that your back sags into a large arch, like an old horse.  Hold this position for __________ seconds.  Repeat this until you feel limber in your lower back.  Now, find your "sweet spot." This will be the most comfortable position somewhere between the two previous positions. This is your neutral spine. Once you have found this position, tense your stomach muscles to support your lower back.  Hold this position for __________ seconds. Repeat __________ times. Complete this exercise __________ times per day.  STRENGTHENING EXERCISES - Low Back Strain These exercises may help you when beginning to rehabilitate your injury. These exercises should be done near your "sweet spot." This is the neutral, low-back arch, somewhere between fully rounded and fully arched, that is your least painful position. When performed in this safe range of motion, these exercises can be used for people who have either a flexion or extension based injury. These exercises may resolve your symptoms with or without further  involvement from your physician, physical therapist or athletic trainer. While completing these exercises, remember:   Muscles can gain both the endurance and the strength needed for everyday activities through controlled exercises.  Complete these exercises as instructed by your physician, physical therapist or athletic trainer. Increase the resistance and repetitions only as guided.  You may experience muscle soreness or fatigue, but the pain or discomfort you are trying to eliminate should never worsen during these exercises. If this pain does worsen, stop and make certain you are following the directions exactly. If the pain is still present after adjustments, discontinue the exercise until you can discuss the trouble with your caregiver. STRENGTHENING - Deep Abdominals, Pelvic Tilt  Lie on a firm bed or floor. Keeping your legs in front of you, bend your knees so they are both pointed toward the ceiling and your feet are flat on the floor.  Tense your lower abdominal muscles to press your lower back into the floor. This motion will rotate your pelvis so that your tail bone is scooping upwards rather than pointing at your feet or into the floor.  With a gentle tension and even breathing, hold this position for __________ seconds. Repeat __________ times. Complete this exercise __________ times per day.  STRENGTHENING - Abdominals, Crunches   Lie on a firm bed or floor. Keeping your legs in front of you, bend your knees so they are both pointed toward the ceiling and your feet are flat on the floor. Cross your arms over your chest.  Slightly tip your chin down without bending your neck.  Tense your abdominals and slowly lift your trunk high enough to just clear your shoulder blades. Lifting higher can put excessive stress on the lower back and does not further strengthen your abdominal muscles.  Control your return to the starting position. Repeat __________ times. Complete this exercise  __________ times per day.  STRENGTHENING - Quadruped, Opposite UE/LE Lift   Assume a hands and knees position on a firm surface. Keep your hands under your shoulders and your knees under your hips. You may place padding under your knees for comfort.  Find your neutral spine and gently tense your abdominal muscles so that you can maintain this position. Your shoulders and hips should form a rectangle that is parallel with the floor and is not twisted.  Keeping your trunk steady, lift your right hand no higher than your shoulder and then your left  leg no higher than your hip. Make sure you are not holding your breath. Hold this position __________ seconds.  Continuing to keep your abdominal muscles tense and your back steady, slowly return to your starting position. Repeat with the opposite arm and leg. Repeat __________ times. Complete this exercise __________ times per day.  STRENGTHENING - Lower Abdominals, Double Knee Lift  Lie on a firm bed or floor. Keeping your legs in front of you, bend your knees so they are both pointed toward the ceiling and your feet are flat on the floor.  Tense your abdominal muscles to brace your lower back and slowly lift both of your knees until they come over your hips. Be certain not to hold your breath.  Hold __________ seconds. Using your abdominal muscles, return to the starting position in a slow and controlled manner. Repeat __________ times. Complete this exercise __________ times per day.  POSTURE AND BODY MECHANICS CONSIDERATIONS - Low Back Strain Keeping correct posture when sitting, standing or completing your activities will reduce the stress put on different body tissues, allowing injured tissues a chance to heal and limiting painful experiences. The following are general guidelines for improved posture. Your physician or physical therapist will provide you with any instructions specific to your needs. While reading these guidelines, remember:  The  exercises prescribed by your provider will help you have the flexibility and strength to maintain correct postures.  The correct posture provides the best environment for your joints to work. All of your joints have less wear and tear when properly supported by a spine with good posture. This means you will experience a healthier, less painful body.  Correct posture must be practiced with all of your activities, especially prolonged sitting and standing. Correct posture is as important when doing repetitive low-stress activities (typing) as it is when doing a single heavy-load activity (lifting). RESTING POSITIONS Consider which positions are most painful for you when choosing a resting position. If you have pain with flexion-based activities (sitting, bending, stooping, squatting), choose a position that allows you to rest in a less flexed posture. You would want to avoid curling into a fetal position on your side. If your pain worsens with extension-based activities (prolonged standing, working overhead), avoid resting in an extended position such as sleeping on your stomach. Most people will find more comfort when they rest with their spine in a more neutral position, neither too rounded nor too arched. Lying on a non-sagging bed on your side with a pillow between your knees, or on your back with a pillow under your knees will often provide some relief. Keep in mind, being in any one position for a prolonged period of time, no matter how correct your posture, can still lead to stiffness. PROPER SITTING POSTURE In order to minimize stress and discomfort on your spine, you must sit with correct posture. Sitting with good posture should be effortless for a healthy body. Returning to good posture is a gradual process. Many people can work toward this most comfortably by using various supports until they have the flexibility and strength to maintain this posture on their own. When sitting with proper posture,  your ears will fall over your shoulders and your shoulders will fall over your hips. You should use the back of the chair to support your upper back. Your lower back will be in a neutral position, just slightly arched. You may place a small pillow or folded towel at the base of your lower back for  support.  When working at a desk, create an environment that supports good, upright posture. Without extra support, muscles tire, which leads to excessive strain on joints and other tissues. Keep these recommendations in mind: CHAIR:  A chair should be able to slide under your desk when your back makes contact with the back of the chair. This allows you to work closely.  The chair's height should allow your eyes to be level with the upper part of your monitor and your hands to be slightly lower than your elbows. BODY POSITION  Your feet should make contact with the floor. If this is not possible, use a foot rest.  Keep your ears over your shoulders. This will reduce stress on your neck and lower back. INCORRECT SITTING POSTURES  If you are feeling tired and unable to assume a healthy sitting posture, do not slouch or slump. This puts excessive strain on your back tissues, causing more damage and pain. Healthier options include:  Using more support, like a lumbar pillow.  Switching tasks to something that requires you to be upright or walking.  Talking a brief walk.  Lying down to rest in a neutral-spine position. PROLONGED STANDING WHILE SLIGHTLY LEANING FORWARD  When completing a task that requires you to lean forward while standing in one place for a long time, place either foot up on a stationary 2-4 inch high object to help maintain the best posture. When both feet are on the ground, the lower back tends to lose its slight inward curve. If this curve flattens (or becomes too large), then the back and your other joints will experience too much stress, tire more quickly, and can cause  pain. CORRECT STANDING POSTURES Proper standing posture should be assumed with all daily activities, even if they only take a few moments, like when brushing your teeth. As in sitting, your ears should fall over your shoulders and your shoulders should fall over your hips. You should keep a slight tension in your abdominal muscles to brace your spine. Your tailbone should point down to the ground, not behind your body, resulting in an over-extended swayback posture.  INCORRECT STANDING POSTURES  Common incorrect standing postures include a forward head, locked knees and/or an excessive swayback. WALKING Walk with an upright posture. Your ears, shoulders and hips should all line-up. PROLONGED ACTIVITY IN A FLEXED POSITION When completing a task that requires you to bend forward at your waist or lean over a low surface, try to find a way to stabilize 3 out of 4 of your limbs. You can place a hand or elbow on your thigh or rest a knee on the surface you are reaching across. This will provide you more stability so that your muscles do not fatigue as quickly. By keeping your knees relaxed, or slightly bent, you will also reduce stress across your lower back. CORRECT LIFTING TECHNIQUES DO :   Assume a wide stance. This will provide you more stability and the opportunity to get as close as possible to the object which you are lifting.  Tense your abdominals to brace your spine. Bend at the knees and hips. Keeping your back locked in a neutral-spine position, lift using your leg muscles. Lift with your legs, keeping your back straight.  Test the weight of unknown objects before attempting to lift them.  Try to keep your elbows locked down at your sides in order get the best strength from your shoulders when carrying an object.  Always ask for help  when lifting heavy or awkward objects. INCORRECT LIFTING TECHNIQUES DO NOT:   Lock your knees when lifting, even if it is a small object.  Bend and  twist. Pivot at your feet or move your feet when needing to change directions.  Assume that you can safely pick up even a paper clip without proper posture.   This information is not intended to replace advice given to you by your health care provider. Make sure you discuss any questions you have with your health care provider.   Document Released: 01/28/2005 Document Revised: 02/18/2014 Document Reviewed: 05/12/2008 [employer] Interactive Patient Education 2016 [employer]   Back Pain, Adult Back pain is very common in adults.The cause of back pain is rarely dangerous and the pain often gets better over time.The cause of your back pain may not be known. Some common causes of back pain include:  Strain of the muscles or ligaments supporting the spine.  Wear and tear (degeneration) of the spinal disks.  Arthritis.  Direct injury to the back. For many people, back pain may return. Since back pain is rarely dangerous, most people can learn to manage this condition on their own. HOME CARE INSTRUCTIONS Watch your back pain for any changes. The following actions may help to lessen any discomfort you are feeling:  Remain active. It is stressful on your back to sit or stand in one place for long periods of time. Do not sit, drive, or stand in one place for more than 30 minutes at a time. Take short walks on even surfaces as soon as you are able.Try to increase the length of time you walk each day.  Exercise regularly as directed by your health care provider. Exercise helps your back heal faster. It also helps avoid future injury by keeping your muscles strong and flexible.  Do not stay in bed.Resting more than 1-2 days can delay your recovery.  Pay attention to your body when you bend and lift. The most comfortable positions are those that put less stress on your recovering back. Always use proper lifting techniques, including:  Bending your knees.  Keeping the load close to your  body.  Avoiding twisting.  Find a comfortable position to sleep. Use a firm mattress and lie on your side with your knees slightly bent. If you lie on your back, put a pillow under your knees.  Avoid feeling anxious or stressed.Stress increases muscle tension and can worsen back pain.It is important to recognize when you are anxious or stressed and learn ways to manage it, such as with exercise.  Take medicines only as directed by your health care provider. Over-the-counter medicines to reduce pain and inflammation are often the most helpful.Your health care provider may prescribe muscle relaxant drugs.These medicines help dull your pain so you can more quickly return to your normal activities and healthy exercise.  Apply ice to the injured area:  Put ice in a plastic bag.  Place a towel between your skin and the bag.  Leave the ice on for 20 minutes, 2-3 times a day for the first 2-3 days. After that, ice and heat may be alternated to reduce pain and spasms.  Maintain a healthy weight. Excess weight puts extra stress on your back and makes it difficult to maintain good posture. SEEK MEDICAL CARE IF:  You have pain that is not relieved with rest or medicine.  You have increasing pain going down into the legs or buttocks.  You have pain that does not improve  in one week.  You have night pain.  You lose weight.  You have a fever or chills. SEEK IMMEDIATE MEDICAL CARE IF:   You develop new bowel or bladder control problems.  You have unusual weakness or numbness in your arms or legs.  You develop nausea or vomiting.  You develop abdominal pain.  You feel faint.   This information is not intended to replace advice given to you by your health care provider. Make sure you discuss any questions you have with your health care provider.   Document Released: 01/28/2005 Document Revised: 02/18/2014 Document Reviewed: 06/01/2013 Elsevier Interactive Patient Education 2016  [employer]. Fatigue Fatigue is feeling tired all of the time, a lack of energy, or a lack of motivation. Occasional or mild fatigue is often a normal response to activity or life in general. However, long-lasting (chronic) or extreme fatigue may indicate an underlying medical condition. HOME CARE INSTRUCTIONS  Watch your fatigue for any changes. The following actions may help to lessen any discomfort you are feeling:  Talk to your health care provider about how much sleep you need each night. Try to get the required amount every night.  Take medicines only as directed by your health care provider.  Eat a healthy and nutritious diet. Ask your health care provider if you need help changing your diet.  Drink enough fluid to keep your urine clear or pale yellow.  Practice ways of relaxing, such as yoga, meditation, massage therapy, or acupuncture.  Exercise regularly.   Change situations that cause you stress. Try to keep your work and personal routine reasonable.  Do not abuse illegal drugs.  Limit alcohol intake to no more than 1 drink per day for nonpregnant women and 2 drinks per day for men. One drink equals 12 ounces of beer, 5 ounces of wine, or 1 ounces of hard liquor.  Take a multivitamin, if directed by your health care provider. SEEK MEDICAL CARE IF:   Your fatigue does not get better.  You have a fever.   You have unintentional weight loss or gain.  You have headaches.   You have difficulty:   Falling asleep.  Sleeping throughout the night.  You feel angry, guilty, anxious, or sad.   You are unable to have a bowel movement (constipation).   You skin is dry.   Your legs or another part of your body is swollen.  SEEK IMMEDIATE MEDICAL CARE IF:   You feel confused.   Your vision is blurry.  You feel faint or pass out.   You have a severe headache.   You have severe abdominal, pelvic, or back pain.   You have chest pain, shortness of  breath, or an irregular or fast heartbeat.   You are unable to urinate or you urinate less than normal.   You develop abnormal bleeding, such as bleeding from the rectum, vagina, nose, lungs, or nipples.  You vomit blood.   You have thoughts about harming yourself or committing suicide.   You are worried that you might harm someone else.    This information is not intended to replace advice given to you by your health care provider. Make sure you discuss any questions you have with your health care provider.   Document Released: 11/25/2006 Document Revised: 02/18/2014 Document Reviewed: 06/01/2013 Elsevier Interactive Patient Education 2016 [employer].      IF you received an x-ray today, you will receive an invoice from [facility]. Please contact [facility] at  [phone] with questions or concerns regarding your invoice.   IF you received labwork today, you will receive an invoice from [employer]. Please contact [facility] at [phone] with questions or concerns regarding your invoice.   Our billing staff will not be able to assist you with questions regarding bills from these companies.  You will be contacted with the lab results as soon as they are available. The fastest way to get your results is to activate your My Chart account. Instructions are located on the last page of this paperwork. If you have not heard from [place] regarding the results in 2 weeks, please contact this office.    We recommend that you schedule a mammogram for breast cancer screening. Typically, you do not need a referral to do this. Please contact a local imaging center to schedule your mammogram.  [facility] - [phone]  *ask for the Radiology Department [facility]) - [phone] or [phone]  [facility] - [phone] [place] [phone] [facility] - [phone]  *ask for the [facility] - [phone]  *ask for the Radiology Department [facility] - [phone]  *ask for the [facility] - [phone]

## 2015-10-18 LAB — COMPLETE METABOLIC PANEL WITH GFR
ALT: 31 U/L — ABNORMAL HIGH (ref 6–29)
AST: 21 U/L (ref 10–35)
Albumin: 4.1 g/dL (ref 3.6–5.1)
Alkaline Phosphatase: 90 U/L (ref 33–130)
BUN: 18 mg/dL (ref 7–25)
CHLORIDE: 105 mmol/L (ref 98–110)
CO2: 25 mmol/L (ref 20–31)
Calcium: 9.3 mg/dL (ref 8.6–10.4)
Creat: 0.72 mg/dL (ref 0.50–1.05)
Glucose, Bld: 102 mg/dL — ABNORMAL HIGH (ref 65–99)
POTASSIUM: 3.9 mmol/L (ref 3.5–5.3)
Sodium: 142 mmol/L (ref 135–146)
Total Bilirubin: 0.6 mg/dL (ref 0.2–1.2)
Total Protein: 7.2 g/dL (ref 6.1–8.1)

## 2015-10-18 LAB — LIPID PANEL
Cholesterol: 236 mg/dL — ABNORMAL HIGH (ref 125–200)
HDL: 41 mg/dL — AB (ref >=46)
LDL CALC: 160 mg/dL — AB (ref <130)
TRIGLYCERIDES: 173 mg/dL — AB (ref <150)
Total CHOL/HDL Ratio: 5.8 Ratio — ABNORMAL HIGH (ref <=5.0)
VLDL: 35 mg/dL — AB (ref <30)

## 2015-10-18 LAB — TSH: TSH: 10.78 m[IU]/L — AB

## 2015-10-30 MED ORDER — LEVOTHYROXINE SODIUM 112 MCG PO TABS: 112.0000 ug | ORAL_TABLET | Freq: Every day | ORAL | 2 refills | Status: DC

## 2015-10-30 NOTE — Addendum Note (Signed)
Addended by: Merri Ray R on: 10/30/2015 08:25 AM   Modules accepted: Orders

## 2015-11-02 ENCOUNTER — Ambulatory Visit (INDEPENDENT_AMBULATORY_CARE_PROVIDER_SITE_OTHER): Payer: 59 | Admitting: Physician Assistant

## 2015-11-02 VITALS — BP 104/66 | HR 71 | Temp 98.1°F | Resp 18 | Ht 65.0 in | Wt 197.0 lb

## 2015-11-02 DIAGNOSIS — Z139 Encounter for screening, unspecified: Secondary | ICD-10-CM

## 2015-11-02 DIAGNOSIS — Z13220 Encounter for screening for lipoid disorders: Secondary | ICD-10-CM | POA: Diagnosis not present

## 2015-11-02 DIAGNOSIS — Z0000 Encounter for general adult medical examination without abnormal findings: Secondary | ICD-10-CM | POA: Diagnosis not present

## 2015-11-02 DIAGNOSIS — Z124 Encounter for screening for malignant neoplasm of cervix: Secondary | ICD-10-CM

## 2015-11-02 DIAGNOSIS — Z1389 Encounter for screening for other disorder: Secondary | ICD-10-CM

## 2015-11-02 DIAGNOSIS — Z1211 Encounter for screening for malignant neoplasm of colon: Secondary | ICD-10-CM

## 2015-11-02 DIAGNOSIS — Z13228 Encounter for screening for other metabolic disorders: Secondary | ICD-10-CM | POA: Diagnosis not present

## 2015-11-02 DIAGNOSIS — N3001 Acute cystitis with hematuria: Secondary | ICD-10-CM

## 2015-11-02 DIAGNOSIS — Z1239 Encounter for other screening for malignant neoplasm of breast: Secondary | ICD-10-CM | POA: Diagnosis not present

## 2015-11-02 DIAGNOSIS — Z130 Encounter for screening for diseases of the blood and blood-forming organs and certain disorders involving the immune mechanism: Secondary | ICD-10-CM

## 2015-11-02 LAB — POCT URINALYSIS DIP (MANUAL ENTRY)
BILIRUBIN UA: NEGATIVE
Bilirubin, UA: NEGATIVE
Glucose, UA: NEGATIVE
Nitrite, UA: POSITIVE — AB
PROTEIN UA: NEGATIVE
SPEC GRAV UA: 1.015
Urobilinogen, UA: 0.2
pH, UA: 5.5

## 2015-11-02 LAB — POC MICROSCOPIC URINALYSIS (UMFC): MUCUS RE: ABSENT

## 2015-11-02 NOTE — Progress Notes (Signed)
[facility] [address] [place] [address] [phone]  By signing my name below, I, Mesha Guinyard, attest that this documentation has been prepared under the direction and in the presence of Ivar Drape, [place]  Electronically Signed: Verlee Monte, Medical Scribe. 11/02/15. 8:43 AM.  Date:  11/02/2015   Name:  Sheri West   DOB:  04/30/1961   MRN:  [id number]  PCP:  No PCP Per Patient   Chief Complaint  Patient presents with   Annual Exam    History of Present Illness:  Sheri West is a 55 y.o. female patient w/a PMHx of hypothyroidism presents to [facility] for her complete physical.  Diet: Pt doesn't have a specific diet, but eats "regular" food. Pt doesn't eat a lot, but eats vegetables and occasionally eats fried foods. Pt drinks about 2 bottles of water at night.   Urine Incontinence: Pt reports urine incontinence. Pt wears a pad and does keagles without relief to her symptoms. Pt states there is always blood found in her urine when she goes to the doctors office but she doesn't notices it when she urinates. Pt is not sexually active and has not gone to the urologist for her symptoms. Pt denies dysuria, pain with urination, irregular bm, melena, constipation, and diarrhea.  Cancer Screening: Cervical CA: Pt would like to get her pap smear done here. Pt is postmenopausal. Pt denies vaginal bleeding. Breast CA: Pt is not UTD on her mammogram.  Exercise: Pt does not exercise. Pt denies chest pain and palpitations.  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 Pt is not a smoker and does not drink alcohol.  Leg Swelling: Pt reports leg swelling. Pt has reduced leg swelling when she stops working for 3 days. Pt doesn't have any compression stockings  Sleep Schedule: Pt sleeps during the day (8am- 4pm) since she works the night shift.  Immunization: Immunization History  Administered Date(s) Administered    Influenza,inj,Quad PF,36+ Mos 10/17/2015   Depression: Pt doesn't do anything for fun since she works so much, but she watches the TV for relaxation. Depression screen [facility] 2/9 11/02/2015 10/17/2015 03/23/2015 02/16/2015  Decreased Interest 0 0 0 0  Down, Depressed, Hopeless 0 0 0 0  PHQ - 2 Score 0 0 0 0   Hypothyroidism: Pt is compliant with Synthroid Lab Results  Component Value Date   TSH 10.78 (H) 10/17/2015   Patient Active Problem List   Diagnosis Date Noted   Hypothyroidism 10/17/2015    Past Medical History:  Diagnosis Date   Allergy    Thyroid disease     History reviewed. No pertinent surgical history.  Social History  Substance Use Topics   Smoking status: Never Smoker   Smokeless tobacco: Never Used   Alcohol use No    History reviewed. No pertinent family history.  No Known Allergies  Medication list has been reviewed and updated.  Current Outpatient Prescriptions on File Prior to Visit  Medication Sig Dispense Refill   levothyroxine (SYNTHROID, LEVOTHROID) 112 MCG tablet Take 1 tablet (112 mcg total) by mouth daily before breakfast. 30 tablet 2   meloxicam (MOBIC) 7.5 MG tablet Take 1 tablet (7.5 mg total) by mouth daily. 30 tablet 0   No current facility-administered medications on file prior to visit.    Review of Systems  Constitutional: Positive for malaise/fatigue.  Cardiovascular: Positive for leg swelling. Negative for chest pain.  Gastrointestinal: Negative for constipation, diarrhea and  melena.  Genitourinary: Positive for hematuria. Negative for dysuria and frequency.  Musculoskeletal: Positive for back pain.   13 point ROS positive for fatigue, hematuria, and back pain.   Physical Examination: BP 104/66 (BP Location: Right Arm, Patient Position: Sitting, Cuff Size: Small)    Pulse 71    Temp 98.1 F (36.7 C) (Oral)    Resp 18    Ht 5\' 5"  (1.651 m)    Wt 197 lb (89.4 kg)    SpO2 96%    BMI 32.78 kg/m  Ideal Body Weight:  @FLOWAMB [id number]  Physical Exam  Constitutional: She is oriented to person, place, and time. She appears well-developed and well-nourished. No distress.  HENT:  Head: Normocephalic and atraumatic.  Right Ear: Tympanic membrane, external ear and ear canal normal.  Left Ear: Tympanic membrane, external ear and ear canal normal.  Nose: Nose normal. Right sinus exhibits no maxillary sinus tenderness and no frontal sinus tenderness. Left sinus exhibits no maxillary sinus tenderness and no frontal sinus tenderness.  Mouth/Throat: Oropharynx is clear and moist and mucous membranes are normal. No uvula swelling. No oropharyngeal exudate, posterior oropharyngeal edema or posterior oropharyngeal erythema.  Eyes: Conjunctivae and EOM are normal. Pupils are equal, round, and reactive to light.  Neck: Normal range of motion. Neck supple. No thyromegaly present.  Cardiovascular: Normal rate, regular rhythm, normal heart sounds and intact distal pulses.  Exam reveals no gallop, no distant heart sounds and no friction rub.   No murmur heard. Pulses:      Radial pulses are 2+ on the right side, and 2+ on the left side.  Pulmonary/Chest: Effort normal and breath sounds normal. No respiratory distress. She has no decreased breath sounds. She has no wheezes. She has no rhonchi.  Abdominal: Soft. Bowel sounds are normal. She exhibits no distension and no mass. There is no tenderness.  Genitourinary: Vagina normal and uterus normal. There is no rash on the right labia. There is no rash on the left labia. Cervix exhibits no motion tenderness. Right adnexum displays no mass and no tenderness. Left adnexum displays no mass and no tenderness.  Genitourinary Comments: Pap smear nl  Musculoskeletal: Normal range of motion. She exhibits no edema or tenderness.  Lymphadenopathy:       Head (right side): No submandibular, no tonsillar, no preauricular and no posterior auricular adenopathy present.       Head (left  side): No submandibular, no tonsillar, no preauricular and no posterior auricular adenopathy present.    She has no cervical adenopathy.  Neurological: She is alert and oriented to person, place, and time. No cranial nerve deficit. She exhibits normal muscle tone. Coordination normal.  Skin: Skin is warm and dry. She is not diaphoretic.  Psychiatric: She has a normal mood and affect. Her behavior is normal.   Results for orders placed or performed in visit on 11/02/15  POCT urinalysis dipstick  Result Value Ref Range   Color, UA yellow yellow   Clarity, UA clear clear   Glucose, UA negative negative   Bilirubin, UA negative negative   Ketones, POC UA negative negative   Spec Grav, UA 1.015    Blood, UA moderate (A) negative   pH, UA 5.5    Protein Ur, POC negative negative   Urobilinogen, UA 0.2    Nitrite, UA Positive (A) Negative   Leukocytes, UA small (1+) (A) Negative  POCT Microscopic Urinalysis ([facility])  Result Value Ref Range   WBC,UR,HPF,POC Few (A) None WBC/hpf  RBC,UR,HPF,POC None None RBC/hpf   Bacteria Few (A) None, Too numerous to count   Mucus Absent Absent   Epithelial Cells, UR Per Microscopy Few (A) None, Too numerous to count cells/hpf   Lab Results  Component Value Date   WBC 6.9 10/17/2015   HGB 14.5 10/17/2015   HCT 42.8 10/17/2015   MCV 86.5 10/17/2015   PLT 270 10/17/2015   Assessment and Plan: Sheri West is a 55 y.o. female who is here today for physical exam.   Pt was advised that her TSH was elevated and she should increase her Synthroid to 112 mcg and return in 6-8 weeks for thyroid recheck. Pt expressed understanding with discussed plan. Physical exam normal.  I am going to issue cipro twice per day for 5 days.  rtc as needed.   Annual physical exam - Plan: POCT urinalysis dipstick, MM DIGITAL SCREENING BILATERAL, Ambulatory referral to Gastroenterology, POCT Microscopic Urinalysis ([facility]), Pap IG w/ reflex to HPV when ASC-U, CANCELED:  CBC, CANCELED: COMPLETE METABOLIC PANEL WITH GFR, CANCELED: Lipid panel  Screening for lipid disorders - Plan: CANCELED: Lipid panel  Screening for metabolic disorder - Plan: POCT Microscopic Urinalysis ([facility]), CANCELED: COMPLETE METABOLIC PANEL WITH GFR  Screening for hematuria or proteinuria - Plan: POCT urinalysis dipstick, POCT Microscopic Urinalysis ([facility])  Screening for deficiency anemia - Plan: CANCELED: CBC  Screening for breast cancer - Plan: MM DIGITAL SCREENING BILATERAL  Special screening for malignant neoplasms, colon - Plan: Ambulatory referral to Gastroenterology  Screening for cervical cancer - Plan: Pap IG w/ reflex to HPV when ASC-U   Ivar Drape, PA-C [facility] 11/02/2015 8:43 AM I personally performed the services described in this documentation, which was scribed in my presence. The recorded information has been reviewed and is accurate.

## 2015-11-02 NOTE — Progress Notes (Signed)
yelow

## 2015-11-02 NOTE — Patient Instructions (Addendum)
     IF you received an x-ray today, you will receive an invoice from [facility]. Please contact [facility] at [phone] with questions or concerns regarding your invoice.   IF you received labwork today, you will receive an invoice from [facility]. Please contact [facility] at [phone] with questions or concerns regarding your invoice.   Our billing staff will not be able to assist you with questions regarding bills from these companies.  You will be contacted with the lab results as soon as they are available. The fastest way to get your results is to activate your My Chart account. Instructions are located on the last page of this paperwork. If you have not heard from us regarding the results in 2 weeks, please contact this office.     We recommend that you schedule a mammogram for breast cancer screening. Typically, you do not need a referral to do this. Please contact a local imaging center to schedule your mammogram.  [facility] - [phone]  *ask for the Radiology Department [facility] - [phone] or [phone]  [facility] - [phone] [facility] - [phone] [facility] - [phone]  *ask for the Radiology Department [facility] - [phone]  *ask for the Radiology Department [facility] - [phone]  *ask for the Mammography Department [facility] - [phone]  

## 2015-11-04 MED ORDER — CIPROFLOXACIN HCL 500 MG PO TABS: 500.0000 mg | ORAL_TABLET | Freq: Two times a day (BID) | ORAL | 0 refills | Status: DC

## 2015-11-06 ENCOUNTER — Telehealth: Payer: Self-pay | Admitting: *Deleted

## 2015-11-06 ENCOUNTER — Encounter: Payer: Self-pay | Admitting: Gastroenterology

## 2015-11-06 LAB — PAP IG W/ RFLX HPV ASCU

## 2015-11-06 NOTE — Telephone Encounter (Signed)
-----   Message from Wendie Agreste, MD sent at 10/30/2015  8:25 AM EDT ----- Call patient. TSH was elevated slightly, indicating stronger dose of synthroid may be needed. I sent in 126mcg dose, but return in 6-8 weeks after taking this dose to repeat tsh. Blood sugar and one of liver tests borderline elevated. Can recheck these at next visit. Also cholesterol was too high. What cholesterol medicine and dose was she taking previously so I can send that in?

## 2015-11-06 NOTE — Telephone Encounter (Signed)
Patient notified of results.  She stated that she has never taken a cholesterol medication but is willing to start one.

## 2015-11-08 ENCOUNTER — Telehealth: Payer: Self-pay | Admitting: Emergency Medicine

## 2015-11-08 MED ORDER — ATORVASTATIN CALCIUM 10 MG PO TABS: 10.0000 mg | ORAL_TABLET | Freq: Every day | ORAL | 0 refills | Status: DC

## 2015-11-08 NOTE — Telephone Encounter (Signed)
Will start with Lipitor 10 mg daily. Recheck in the next 6 weeks for both thyroid test and cholesterol tests as well as liver tests to make sure she is tolerating that medication. Sooner if any new or worsening symptoms.

## 2015-11-10 ENCOUNTER — Telehealth: Payer: Self-pay | Admitting: Emergency Medicine

## 2015-11-10 NOTE — Telephone Encounter (Signed)
Pt aware of results and medication instructions.  

## 2015-11-22 ENCOUNTER — Ambulatory Visit: Payer: Managed Care, Other (non HMO)

## 2015-12-14 ENCOUNTER — Other Ambulatory Visit: Payer: Self-pay | Admitting: Physician Assistant

## 2015-12-14 DIAGNOSIS — Z1231 Encounter for screening mammogram for malignant neoplasm of breast: Secondary | ICD-10-CM

## 2015-12-28 ENCOUNTER — Encounter: Payer: Managed Care, Other (non HMO) | Admitting: Gastroenterology

## 2016-01-01 ENCOUNTER — Ambulatory Visit: Payer: Managed Care, Other (non HMO)

## 2016-05-01 ENCOUNTER — Ambulatory Visit (INDEPENDENT_AMBULATORY_CARE_PROVIDER_SITE_OTHER): Payer: 59 | Admitting: Physician Assistant

## 2016-05-01 VITALS — BP 148/84 | HR 70 | Temp 98.4°F | Resp 14 | Ht 64.0 in | Wt 205.0 lb

## 2016-05-01 DIAGNOSIS — Z79899 Other long term (current) drug therapy: Secondary | ICD-10-CM | POA: Diagnosis not present

## 2016-05-01 DIAGNOSIS — Z131 Encounter for screening for diabetes mellitus: Secondary | ICD-10-CM

## 2016-05-01 DIAGNOSIS — E785 Hyperlipidemia, unspecified: Secondary | ICD-10-CM | POA: Diagnosis not present

## 2016-05-01 DIAGNOSIS — M545 Low back pain, unspecified: Secondary | ICD-10-CM

## 2016-05-01 DIAGNOSIS — E039 Hypothyroidism, unspecified: Secondary | ICD-10-CM

## 2016-05-01 DIAGNOSIS — G8929 Other chronic pain: Secondary | ICD-10-CM

## 2016-05-01 MED ORDER — ATORVASTATIN CALCIUM 10 MG PO TABS: 10.0000 mg | ORAL_TABLET | Freq: Every day | ORAL | 3 refills | Status: DC

## 2016-05-01 MED ORDER — MELOXICAM 7.5 MG PO TABS: 7.5000 mg | ORAL_TABLET | Freq: Every day | ORAL | 0 refills | Status: DC

## 2016-05-01 MED ORDER — LEVOTHYROXINE SODIUM 112 MCG PO TABS: 112.0000 ug | ORAL_TABLET | Freq: Every day | ORAL | 6 refills | Status: DC

## 2016-05-01 NOTE — Progress Notes (Signed)
Sheri West  MRN: [id number] DOB: 04/30/1961  PCP: No PCP Per Patient  Subjective:  Pt is a 56 year old female PMH hypothyroidism who presents to clinic for thyroid check and medication refill. Pt had medication changes 6 months ago at her annual exam as her TSH and lipids were elevated.   Hypothyroidism - synthroid 112 mcg. Her medication dose was increased 6 months ago due to elevated TSH. She has not taken medication x 3 months. She went to [place] for work and didn't have any more refills.   She reports gain in weight and "feeling lazy".   HLD - Her lipid panel was abnormal 10/2015. Started on Lipitor. She has not done anything to change her diet. She is not exercising.   Chronic low back pain - Meloxicam every few days for back pain. She also uses icy-hot. C/o b/l knee pain and swelling   Last annual exam 10/2015.   Review of Systems  Constitutional: Positive for fatigue. Negative for chills, diaphoresis and fever.  HENT: Negative for congestion.   Respiratory: Negative for cough, chest tightness, shortness of breath and wheezing.   Cardiovascular: Negative for chest pain and palpitations.  Gastrointestinal: Negative for abdominal pain, diarrhea, nausea and vomiting.  Endocrine: Negative for cold intolerance, heat intolerance, polydipsia, polyphagia and polyuria.  Musculoskeletal: Positive for arthralgias and back pain.  Neurological: Negative for dizziness, weakness, light-headedness and headaches.    Patient Active Problem List   Diagnosis Date Noted  . Hypothyroidism 10/17/2015    Current Outpatient Prescriptions on File Prior to Visit  Medication Sig Dispense Refill  . atorvastatin (LIPITOR) 10 MG tablet Take 1 tablet (10 mg total) by mouth daily. 90 tablet 0  . levothyroxine (SYNTHROID, LEVOTHROID) 112 MCG tablet Take 1 tablet (112 mcg total) by mouth daily before breakfast. 30 tablet 2  . meloxicam (MOBIC) 7.5 MG tablet Take 1 tablet (7.5 mg total) by mouth  daily. 30 tablet 0   No current facility-administered medications on file prior to visit.     No Known Allergies   Objective:  BP (!) 148/84   Pulse 70   Temp 98.4 F (36.9 C) (Oral)   Resp 14   Ht 5\' 4"  (1.626 m)   Wt 205 lb (93 kg)   SpO2 97%   BMI 35.19 kg/m   Physical Exam  Constitutional: She is oriented to person, place, and time and well-developed, well-nourished, and in no distress. No distress.  Neck: No thyroid mass and no thyromegaly present.  Cardiovascular: Normal rate, regular rhythm and normal heart sounds.   Neurological: She is alert and oriented to person, place, and time. GCS score is 15.  Skin: Skin is warm and dry.  Psychiatric: Mood, memory, affect and judgment normal.  Vitals reviewed.   Assessment and Plan :  1. Encounter for medication management -Pt has been noncompliant with Synthroid - has not taken this medication x 3 months. She did not have any more refills after 3 months and went to [place] for 2 months for work. She feels "fat and lazy." No lifestyle modifications have been made to correct HLD. Information printed out for healthful diet, encouraged exercise. Labs are pending. F/u in 6 months for annual exam. RTC sooner for knee pain.  2. Hypothyroidism, unspecified type - Thyroid Panel With TSH - levothyroxine (SYNTHROID, LEVOTHROID) 112 MCG tablet; Take 1 tablet (112 mcg total) by mouth daily before breakfast.  Dispense: 30 tablet; Refill: 6  3. Dyslipidemia - Lipid panel - atorvastatin (  LIPITOR) 10 MG tablet; Take 1 tablet (10 mg total) by mouth daily.  Dispense: 90 tablet; Refill: 3  4. Screening for diabetes mellitus - Hemoglobin A1c  5. Chronic bilateral low back pain without sciatica - meloxicam (MOBIC) 7.5 MG tablet; Take 1 tablet (7.5 mg total) by mouth daily.  Dispense: 30 tablet; Refill: 0   Mercer Pod, PA-C  [facility] [place] 05/01/2016 8:39 AM

## 2016-05-01 NOTE — Patient Instructions (Addendum)
Please come back in 6 months for your annual exam. We will recheck your cholesterol and thyroid at that time.  See below for ways to help correct your cholesterol through the foods you eat.   Thank you for coming in today. I hope you feel we met your needs.  Feel free to call [facility] if you have any questions or further requests.  Please consider signing up for MyChart if you do not already have it, as this is a great way to communicate with me.  Best,  Mercer Pod, PA-C  Dieta restringida en grasas y colesterol (Fat and Cholesterol Restricted Diet) Los niveles altos de grasa y colesterol en la sangre pueden causar varios problemas de salud, como enfermedades del corazn, de los vasos sanguneos, de la [place], del hgado y del pncreas. Las grasas son fuentes de Teacher, early years/pre concentrada que existen en varias formas. Consumir en exceso ciertos tipos de grasa, incluidas las grasas saturadas, puede ser perjudicial. El colesterol es una sustancia que el organismo necesita en pequeas cantidades. El cuerpo Dominica todo el colesterol que necesita. El exceso de colesterol proviene de los alimentos que come. Si sus niveles de colesterol y grasas saturadas en la sangre son elevados, puede tener problemas de salud, dado que el exceso de grasa y Research officer, trade union se acumula en las paredes de los vasos sanguneos, provocando su Public librarian. Elegir los Retail buyer su ingesta de grasa y [place]. Esto le ayudar a [employer] de estas sustancias en la sangre dentro de los lmites normales y a reducir el riesgo de Public librarian. EN QU CONSISTE EL PLAN? El mdico le recomienda que:  Limite la ingesta de grasas a alrededor del _______% del total de caloras por da.  Limite la cantidad de colesterol en su dieta a menos de _________mg Darlin Coco.  Consuma entre 61 y 55gramos de [employer]ning [employer]. QU TIPOS DE GRASAS DEBO ELEGIR?  Elija grasas saludables con mayor  frecuencia. Elija las grasas monoinsaturadas y poliinsaturadas, como el aceite de oliva y canola, las semillas de [place], las nueces, las almendras y las semillas.  Consuma ms grasas omega-3. Las mejores opciones incluyen salmn, caballa, sardinas, atn, aceite de lino y semillas de lino molidas. Trate de consumir pescado al [employer]us veces por semana.  Limite el consumo de grasas saturadas. Estas se encuentran principalmente en los productos de origen animal, como las carnes, la [place] y la crema. Las grasas saturadas de origen vegetal incluyen aceite de palma, de palmiste y de coco.  Evite los alimentos con aceites parcialmente hidrogenados. Estos contienen grasas trans. Entre los ejemplos de alimentos con grasas trans se incluyen margarinas en barra, algunas margarinas untables, galletas dulces o saladas y otros productos horneados. [place]? Estas pautas para una alimentacin saludable le ayudarn a controlar su ingesta de grasa y colesterol:  Lea las etiquetas de los alimentos detenidamente para identificar los que contienen grasas trans o altas cantidades de grasas saturadas.  Llene la mitad del plato con verduras y ensaladas de hojas verdes.  Llene un cuarto del plato con cereales integrales. Busque la palabra "integral" en Equities trader de la lista de ingredientes.  Llene un cuarto del plato con alimentos con protenas magras.  [place] a dos porciones por da. Elija frutas en lugar de jugos.  Coma ms alimentos que contienen fibra, como [place], [place], [place], frijoles, guisantes y [place].  Consuma ms comida casera y menos de restaurante, de buf y comida  rpida.  Limite o evite el alcohol.  Limite los alimentos con alto contenido de almidn y Location manager.  Limite el consumo de alimentos fritos.  Cocine los alimentos utilizando mtodos que no sean la fritura. Las opciones de coccin ms [place] son Teacher, music, Adult nurse, Interior and spatial designer y asar a  Administrator, arts.  Baje de peso si es necesario. Perder solo del 5 al 10% de su peso inicial puede ayudarle a mejorar su estado de salud general y a Producer, television/film/video, como la diabetes y las enfermedades cardacas. QU ALIMENTOS PUEDO COMER? Cereales Cereales integrales, como los panes de salvado o [place], las [place], los cereales y las pastas. Avena sin endulzar, trigo, [place], quinua o arroz integral. Tortillas de harina de maz o de salvado. Verduras Verduras frescas o congeladas (crudas, al vapor, asadas o grilladas). Ensaladas de hojas verdes. Lambert Mody Lambert Mody frescas, en conserva (en su jugo natural) o frutas congeladas. Carnes y otros productos con protenas Carne de res molida (al 85% o ms [place]), carne de res de animales alimentados con pastos o carne de res sin la grasa. Pollo o pavo sin piel. Carne de pollo o de [place]. Cerdo sin la grasa. Todos los pescados y frutos de mar. Huevos. Porotos, guisantes o lentejas secos. Frutos secos o semillas sin sal. Frijoles secos o en lata sin sal. Lcteos Productos lcteos con bajo contenido de grasas, como [place] o al 1%, quesos reducidos en grasas o al 2%, ricota con bajo contenido de grasas o [employer], o yogur natural con bajo contenido de [place]. Grasas y [employer] Express untables que no contengan grasas trans. Mayonesa y condimentos para ensaladas livianos o reducidos en grasas. [place]. Aceites de oliva, canola, ssamo o crtamo. [place] natural de cacahuate o almendra (elija la que no tenga agregado de aceite o azcar). Los artculos mencionados arriba pueden no ser Dean Foods Company de las bebidas o los alimentos recomendados. Comunquese con el nutricionista para conocer ms opciones. QU ALIMENTOS NO SE RECOMIENDAN? Cereales Pan blanco. Pastas blancas. Arroz blanco. Pan de maz. Bagels, pasteles y croissants. Galletas saladas que contengan grasas trans. Verduras Papas blancas. MazHolland Commons con crema  o fritas. Verduras en [place]. Lambert Mody Frutas secas. Fruta enlatada en almbar liviano o espeso. Jugo de frutas. Carnes y otros productos con protenas Cortes de carne con Lobbyist. Costillas, alas de pollo, tocineta, salchicha, mortadela, salame, chinchulines, tocino, perros calientes, salchichas alemanas y embutidos envasados. Hgado y otros rganos. Lcteos Leche entera o al 2%, crema, mezcla de [place] y crema, y queso crema. Quesos enteros. Yogur entero o endulzado. Quesos con toda su grasa. Cremas no lcteas y coberturas batidas. Quesos procesados, quesos para untar o cuajadas. Dulces y postres Jarabe de maz, azcares, miel y Control and instrumentation engineer. Caramelos. Mermelada y [place]. Chrissie Noa. Cereales endulzados. Galletas, pasteles, bizcochuelos, donas, muffins y helado. Grasas y aceites [place], [place] en barra, [place] de [place]vin, [place], [place] clarificada o grasa de tocino. Aceites de coco, de palmiste o de palma. Bebidas Alcohol. Bebidas endulzadas (como refrescos, limonadas y bebidas frutales o ponches). Los artculos mencionados arriba pueden no ser Dean Foods Company de las bebidas y los alimentos que se Higher education careers adviser. Comunquese con el nutricionista para obtener ms informacin. Esta informacin no tiene Marine scientist el consejo del mdico. Asegrese de hacerle al mdico cualquier pregunta que tenga. Document Released: 01/28/2005 Document Revised: 02/18/2014 Document Reviewed: 04/28/2013 Elsevier Interactive Patient Education  2017 [employer].  IF you received an x-ray today, you will receive an invoice from [facility] Radiology. Please contact  [facility] Radiology at [phone] with questions or concerns regarding your invoice.   IF you received labwork today, you will receive an invoice from [place]. Please contact [facility] at [phone] with questions or concerns regarding your invoice.   Our billing staff will not be able to assist you with questions regarding bills from  these companies.  You will be contacted with the lab results as soon as they are available. The fastest way to get your results is to activate your My Chart account. Instructions are located on the last page of this paperwork. If you have not heard from [place] regarding the results in 2 weeks, please contact this office.

## 2016-05-02 LAB — THYROID PANEL WITH TSH
Free Thyroxine Index: 2 (ref 1.2–4.9)
T3 Uptake Ratio: 25 % (ref 24–39)
T4, Total: 8.1 ug/dL (ref 4.5–12.0)
TSH: 12 u[IU]/mL — ABNORMAL HIGH (ref 0.450–4.500)

## 2016-05-02 LAB — LIPID PANEL
Chol/HDL Ratio: 5.5 ratio units — ABNORMAL HIGH (ref 0.0–4.4)
Cholesterol, Total: 214 mg/dL — ABNORMAL HIGH (ref 100–199)
HDL: 39 mg/dL — ABNORMAL LOW (ref >39)
LDL Calculated: 145 mg/dL — ABNORMAL HIGH (ref 0–99)
Triglycerides: 150 mg/dL — ABNORMAL HIGH (ref 0–149)
VLDL Cholesterol Cal: 30 mg/dL (ref 5–40)

## 2016-05-02 LAB — HEMOGLOBIN A1C
Est. average glucose Bld gHb Est-mCnc: 140 mg/dL
Hgb A1c MFr Bld: 6.5 % — ABNORMAL HIGH (ref 4.8–5.6)

## 2016-05-07 ENCOUNTER — Telehealth: Payer: Self-pay | Admitting: Physician Assistant

## 2016-05-07 NOTE — Telephone Encounter (Signed)
Pt wanting lab results told her we would call once dr has reviewed them    Please advise [phone]

## 2016-05-14 ENCOUNTER — Encounter: Payer: Self-pay | Admitting: Physician Assistant

## 2016-05-14 NOTE — Progress Notes (Signed)
Your labs are abnormal. It is important to take your cholesterol and thyroid medications on a daily basis.  Your thyroid level is high - this should be corrected with Synthroid.  Your cholesterol levels are high as well. The best way to correct this is through eating a healthy diet and getting regular exercise.  Your hemoglobin A1C is also high, indicating diabetes. I do not want to start you on any medications at this time, as I think this can correct itself through the lifestyle modifications you will make: Please report that patient's total cholesterol, bad cholesterol (LDL) and fatty acids were high. Recommend that the patient eat healthier, cut down on fatty foods, fried foods, limit red meat to once a week, eat balanced meals including a serving of vegetables and/or fruits with every meal. Patient should also start exercising at least 2-3 times per week to help with cholesterol. Please come back in 6 months for repeat labs. Please be fasting.

## 2016-05-14 NOTE — Progress Notes (Unsigned)
Advised pt of lab results. Encouraged lifestyle modification and medication compliance. RTC in 6 months to see provider and for repeat labs.

## 2016-07-03 ENCOUNTER — Other Ambulatory Visit: Payer: Self-pay

## 2016-07-03 DIAGNOSIS — M545 Low back pain: Principal | ICD-10-CM

## 2016-07-03 DIAGNOSIS — G8929 Other chronic pain: Secondary | ICD-10-CM

## 2016-07-03 MED ORDER — MELOXICAM 7.5 MG PO TABS: 7.5000 mg | ORAL_TABLET | Freq: Every day | ORAL | 0 refills | Status: DC

## 2016-07-03 NOTE — Telephone Encounter (Signed)
05/01/16 last ov and refill

## 2016-07-24 ENCOUNTER — Other Ambulatory Visit: Payer: Self-pay | Admitting: Family Medicine

## 2016-07-24 DIAGNOSIS — G8929 Other chronic pain: Secondary | ICD-10-CM

## 2016-07-24 DIAGNOSIS — M545 Low back pain: Principal | ICD-10-CM

## 2016-09-13 ENCOUNTER — Ambulatory Visit (INDEPENDENT_AMBULATORY_CARE_PROVIDER_SITE_OTHER): Payer: 59 | Admitting: Family Medicine

## 2016-09-13 ENCOUNTER — Encounter: Payer: Self-pay | Admitting: Family Medicine

## 2016-09-13 VITALS — BP 137/84 | HR 65 | Temp 98.3°F | Resp 18 | Ht 64.0 in | Wt 204.2 lb

## 2016-09-13 DIAGNOSIS — E039 Hypothyroidism, unspecified: Secondary | ICD-10-CM | POA: Diagnosis not present

## 2016-09-13 DIAGNOSIS — E119 Type 2 diabetes mellitus without complications: Secondary | ICD-10-CM | POA: Diagnosis not present

## 2016-09-13 DIAGNOSIS — M25561 Pain in right knee: Secondary | ICD-10-CM

## 2016-09-13 DIAGNOSIS — Z6835 Body mass index (BMI) 35.0-35.9, adult: Secondary | ICD-10-CM | POA: Diagnosis not present

## 2016-09-13 DIAGNOSIS — R51 Headache: Secondary | ICD-10-CM

## 2016-09-13 DIAGNOSIS — E785 Hyperlipidemia, unspecified: Secondary | ICD-10-CM | POA: Diagnosis not present

## 2016-09-13 DIAGNOSIS — E669 Obesity, unspecified: Secondary | ICD-10-CM

## 2016-09-13 DIAGNOSIS — IMO0001 Reserved for inherently not codable concepts without codable children: Secondary | ICD-10-CM

## 2016-09-13 DIAGNOSIS — R519 Headache, unspecified: Secondary | ICD-10-CM

## 2016-09-13 MED ORDER — LEVOTHYROXINE SODIUM 112 MCG PO TABS: 112.0000 ug | ORAL_TABLET | Freq: Every day | ORAL | 6 refills | Status: DC

## 2016-09-13 NOTE — Progress Notes (Signed)
Subjective:  By signing my name below, I, Sheri West, attest that this documentation has been prepared under the direction and in the presence of Merri Ray, MD. Electronically Signed: Moises West, [place]. 09/13/2016 , 8:31 AM .  Patient was seen in Room 10 .   Patient ID: Sheri West, female    DOB: 01-07-1961, 56 y.o.   MRN: [id number] Chief Complaint  Patient presents with  . Thyroid    Pt would like to have her Thyroid and Cholesterol checked  . Cholesterol  . Knee Problem     both knees, per states it hurts to walk, pt states it has been going on for years.   HPI Sheri West is a 56 y.o. female She was last seen in March.   Hypothyroidism She takes synthroid 112 mcg QD, prescribed in March as she had been non compliant previously for 3 months.   She reports taking it every morning.   Hyperlipidemia She takes lipitor 10 mg QD.   She reports taking her medication daily.   Diabetes Noted on A1C in March, plan for diet changes.  Diet She hasn't changed her diet. She eats fast food occasionally. She denies drinking soda. She informs eating only one meal a day, sometimes just a salad. She drinks plenty of water.   Wt Readings from Last 3 Encounters:  09/13/16 204 lb 3.2 oz (92.6 kg)  05/01/16 205 lb (93 kg)  11/02/15 197 lb (89.4 kg)   Body mass index is 35.05 kg/m.  Exercise She works 72 hours a week. She denies regular exercise.   Knee problem She also complains of bilateral knee pain when she's walking, more pain in the right knee, for about 6 months. She's been taking tylenol once a day for this, without much relief. No known injury.  Headache She also states having headaches when waking up first thing in the morning. No recent change in symptoms.  Patient Active Problem List   Diagnosis Date Noted  . Hypothyroidism 10/17/2015   Past Medical History:  Diagnosis Date  . Allergy   . Thyroid disease    No past surgical history on  file. No Known Allergies Prior to Admission medications   Medication Sig Start Date End Date Taking? Authorizing Provider  atorvastatin (LIPITOR) 10 MG tablet Take 1 tablet (10 mg total) by mouth daily. 05/01/16   McVey, Gelene Mink, PA-C  levothyroxine (SYNTHROID, LEVOTHROID) 112 MCG tablet Take 1 tablet (112 mcg total) by mouth daily before breakfast. 05/01/16   McVey, Gelene Mink, PA-C  meloxicam (MOBIC) 7.5 MG tablet TAKE 1 TABLET(7.5 MG) BY MOUTH DAILY 07/30/16   McVey, Gelene Mink, PA-C   Social History   Social History  . Marital status: Married    Spouse name: N/A  . Number of children: N/A  . Years of education: N/A   Occupational History  . Not on file.   Social History Main Topics  . Smoking status: Never Smoker  . Smokeless tobacco: Never Used  . Alcohol use No  . Drug use: No  . Sexual activity: Not on file   Other Topics Concern  . Not on file   Social History Narrative  . No narrative on file   Review of Systems  Constitutional: Negative for fatigue.  Respiratory: Negative for chest tightness and shortness of breath.   Cardiovascular: Negative for chest pain, palpitations and leg swelling.  Gastrointestinal: Negative for abdominal pain and West in stool.  Musculoskeletal: Positive for back pain.  Neurological:  Positive for headaches. Negative for dizziness, syncope and light-headedness.       Objective:   Physical Exam  Constitutional: She is oriented to person, place, and time. She appears well-developed and well-nourished.  HENT:  Head: Normocephalic and atraumatic.  Eyes: Pupils are equal, round, and reactive to light. Conjunctivae and EOM are normal.  Neck: Carotid bruit is not present.  Thyroid: no palpable nodules or swelling  Cardiovascular: Normal rate, regular rhythm, normal heart sounds and intact distal pulses.   Pulmonary/Chest: Effort normal and breath sounds normal.  Abdominal: Soft. She exhibits no pulsatile midline  mass. There is no tenderness.  Musculoskeletal:  Right knee: skin intact, no erythema, no apparent effusion, jointline non tender, minimal tenderness over the patellar, some discomfort with patellar grind  Neurological: She is alert and oriented to person, place, and time.  Skin: Skin is warm and dry.  Psychiatric: She has a normal mood and affect. Her behavior is normal.  Vitals reviewed.   Vitals:   09/13/16 0809  BP: 137/84  Pulse: 65  Resp: 18  Temp: 98.3 F (36.8 C)  TempSrc: Oral  SpO2: 96%  Weight: 204 lb 3.2 oz (92.6 kg)  Height: 5\' 4"  (1.626 m)      Assessment & Plan:   [name] is a 56 y.o. female Type 2 diabetes mellitus without complication, without long-term current use of insulin ([place]) - Plan: Hemoglobin A1c  - Repeat A1c, stressed importance of regular meals, as she may be not be obtaining sufficient calories, and slowing down her metabolism. Avoid fast food, avoid sugar containing beverages, and walking or swimming for exercise as may be lower intensity for her knees. Follow-up in 2 weeks to review labs and discuss other concerns below.   Hypothyroidism, unspecified type - Plan: TSH, levothyroxine (SYNTHROID, LEVOTHROID) 112 MCG tablet  - Now back on medication, TSH obtained to determine control  Class 2 obesity with serious comorbidity and body mass index (BMI) of 35.0 to 35.9 in adult, unspecified obesity type  - Low intensity exercise, diet changes discussed with more frequent meals and small portion sizes.  Hyperlipidemia, unspecified hyperlipidemia type - Plan: Comprehensive metabolic panel, Lipid panel Dyslipidemia  - Tolerating Lipitor, continue same dose, labs pending  Right knee pain, unspecified chronicity   -No apparent effusion. Possible patellofemoral pain based on symptoms and location. Tylenol over-the-counter for now, working on weight loss should help, but will follow up in 2 weeks for possible x-rays and further  evaluation.  Nonintractable episodic headache, unspecified headache type  - Noted in morning only. Discussed regular meals as above, recheck in 2 weeks to see if that improves her symptoms. RTC precautions if worsening.  Meds ordered this encounter  Medications  . levothyroxine (SYNTHROID, LEVOTHROID) 112 MCG tablet    Sig: Take 1 tablet (112 mcg total) by mouth daily before breakfast.    Dispense:  30 tablet    Refill:  6   Patient Instructions   Eat 3 meals per day, small portions, and avoid fast food. Make sure you are drinking plenty of water throughout the day, avoid sugar containing beverages such as Sweet tea or sodas. I will check diabetes test, cholesterol test and thyroid test today.  Follow-up in 2 weeks to discuss headaches further. I suspect this will improve as you eat more regular meals, please return sooner if any worsening symptoms.  Knee pain appears to be patellofemoral pain or kneecap pain. However please return in 2 weeks to discuss further and possible  x-rays. Tylenol over-the-counter is fine for now, and walking or swimming for exercise.  Return sooner if worse.    We recommend that you schedule a mammogram for breast cancer screening. Typically, you do not need a referral to do this. Please contact a local imaging center to schedule your mammogram.  [facility] - [phone]  *ask for the Radiology Department [facility] [place] ([place]) - [phone] or [phone]  [facility] - [phone] [phone] [facility] - [phone]  *ask for the [facility] - [phone]  *ask for the Radiology Department [facility] - [phone]  *ask for the [place] - [phone]    IF you received an x-ray today, you will receive an invoice from [facility] Radiology. Please contact [facility] Radiology at  [phone] with questions or concerns regarding your invoice.   IF you received labwork today, you will receive an invoice from [place]. Please contact [facility] at [phone] with questions or concerns regarding your invoice.   Our billing staff will not be able to assist you with questions regarding bills from these companies.  You will be contacted with the lab results as soon as they are available. The fastest way to get your results is to activate your My Chart account. Instructions are located on the last page of this paperwork. If you have not heard from [place] regarding the results in 2 weeks, please contact this office.      I personally performed the services described in this documentation, which was scribed in my presence. The recorded information has been reviewed and considered for accuracy and completeness, addended by me as needed, and agree with information above.  Signed,   Merri Ray, MD [facility].  09/13/16 8:37 AM

## 2016-09-13 NOTE — Patient Instructions (Addendum)
Eat 3 meals per day, small portions, and avoid fast food. Make sure you are drinking plenty of water throughout the day, avoid sugar containing beverages such as Sweet tea or sodas. I will check diabetes test, cholesterol test and thyroid test today.  Follow-up in 2 weeks to discuss headaches further. I suspect this will improve as you eat more regular meals, please return sooner if any worsening symptoms.  Knee pain appears to be patellofemoral pain or kneecap pain. However please return in 2 weeks to discuss further and possible x-rays. Tylenol over-the-counter is fine for now, and walking or swimming for exercise.  Return sooner if worse.    We recommend that you schedule a mammogram for breast cancer screening. Typically, you do not need a referral to do this. Please contact a local imaging center to schedule your mammogram.  [facility] - [phone]  *ask for the Radiology Department [facility] [place] ([place]) - [phone] or [phone]  [facility] - [phone] [place] [phone] [facility] - [phone]  *ask for the [facility] - [phone]  *ask for the Radiology Department [facility] - [phone]  *ask for the [place] - [phone]    IF you received an x-ray today, you will receive an invoice from [facility] Radiology. Please contact [facility] Radiology at [phone] with questions or concerns regarding your invoice.   IF you received labwork today, you will receive an invoice from [place]. Please contact [facility] at [phone] with questions or concerns regarding your invoice.   Our billing staff will not be able to assist you with questions regarding bills from these companies.  You will be contacted with the lab results as soon as they are available. The fastest way to get your results is to activate  your My Chart account. Instructions are located on the last page of this paperwork. If you have not heard from [place] regarding the results in 2 weeks, please contact this office.

## 2016-09-14 LAB — COMPREHENSIVE METABOLIC PANEL
A/G RATIO: 1.6 (ref 1.2–2.2)
ALBUMIN: 4.2 g/dL (ref 3.5–5.5)
ALT: 24 IU/L (ref 0–32)
AST: 23 IU/L (ref 0–40)
Alkaline Phosphatase: 90 IU/L (ref 39–117)
BUN/Creatinine Ratio: 15 (ref 9–23)
BUN: 12 mg/dL (ref 6–24)
Bilirubin Total: 0.4 mg/dL (ref 0.0–1.2)
CALCIUM: 9.3 mg/dL (ref 8.7–10.2)
CO2: 25 mmol/L (ref 20–29)
CREATININE: 0.79 mg/dL (ref 0.57–1.00)
Chloride: 102 mmol/L (ref 96–106)
GFR, EST AFRICAN AMERICAN: 96 mL/min/{1.73_m2} (ref >59)
GFR, EST NON AFRICAN AMERICAN: 83 mL/min/{1.73_m2} (ref >59)
GLOBULIN, TOTAL: 2.6 g/dL (ref 1.5–4.5)
Glucose: 115 mg/dL — ABNORMAL HIGH (ref 65–99)
POTASSIUM: 4.1 mmol/L (ref 3.5–5.2)
SODIUM: 143 mmol/L (ref 134–144)
TOTAL PROTEIN: 6.8 g/dL (ref 6.0–8.5)

## 2016-09-14 LAB — HEMOGLOBIN A1C
Est. average glucose Bld gHb Est-mCnc: 131 mg/dL
Hgb A1c MFr Bld: 6.2 % — ABNORMAL HIGH (ref 4.8–5.6)

## 2016-09-14 LAB — LIPID PANEL
CHOL/HDL RATIO: 3.5 ratio (ref 0.0–4.4)
Cholesterol, Total: 139 mg/dL (ref 100–199)
HDL: 40 mg/dL (ref >39)
LDL Calculated: 76 mg/dL (ref 0–99)
Triglycerides: 115 mg/dL (ref 0–149)
VLDL CHOLESTEROL CAL: 23 mg/dL (ref 5–40)

## 2016-09-14 LAB — TSH: TSH: 4.57 u[IU]/mL — ABNORMAL HIGH (ref 0.450–4.500)

## 2017-06-28 ENCOUNTER — Ambulatory Visit: Payer: Managed Care, Other (non HMO) | Admitting: Urgent Care

## 2017-06-30 ENCOUNTER — Ambulatory Visit: Payer: Managed Care, Other (non HMO) | Admitting: Urgent Care

## 2017-07-14 ENCOUNTER — Other Ambulatory Visit: Payer: Self-pay | Admitting: Physician Assistant

## 2017-07-14 DIAGNOSIS — E785 Hyperlipidemia, unspecified: Secondary | ICD-10-CM

## 2017-07-16 NOTE — Telephone Encounter (Signed)
Please review for refill: atorvastatin 10 mg tab -prescription expired on 05/01/17 LOV  09/13/16  Provider Whitney McVey, PA Last lipid panel was 09/13/16  [id number]

## 2017-09-15 ENCOUNTER — Ambulatory Visit
Admission: RE | Admit: 2017-09-15 | Discharge: 2017-09-15 | Disposition: A | Discharge status: Home / Self Care | Payer: Managed Care, Other (non HMO) | Source: Ambulatory Visit | Attending: Urgent Care | Admitting: Urgent Care

## 2017-09-15 ENCOUNTER — Other Ambulatory Visit: Payer: Self-pay | Admitting: Physician Assistant

## 2017-09-15 DIAGNOSIS — Z1231 Encounter for screening mammogram for malignant neoplasm of breast: Secondary | ICD-10-CM

## 2017-10-14 ENCOUNTER — Encounter: Payer: Self-pay | Admitting: Family Medicine

## 2017-10-14 ENCOUNTER — Other Ambulatory Visit: Payer: Self-pay

## 2017-10-14 ENCOUNTER — Ambulatory Visit (INDEPENDENT_AMBULATORY_CARE_PROVIDER_SITE_OTHER): Payer: Managed Care, Other (non HMO) | Admitting: Family Medicine

## 2017-10-14 VITALS — BP 117/77 | HR 65 | Temp 98.6°F | Ht 63.39 in | Wt 199.8 lb

## 2017-10-14 DIAGNOSIS — K219 Gastro-esophageal reflux disease without esophagitis: Secondary | ICD-10-CM | POA: Diagnosis not present

## 2017-10-14 DIAGNOSIS — R7303 Prediabetes: Secondary | ICD-10-CM

## 2017-10-14 DIAGNOSIS — R110 Nausea: Secondary | ICD-10-CM

## 2017-10-14 DIAGNOSIS — R197 Diarrhea, unspecified: Secondary | ICD-10-CM

## 2017-10-14 DIAGNOSIS — E039 Hypothyroidism, unspecified: Secondary | ICD-10-CM

## 2017-10-14 DIAGNOSIS — E785 Hyperlipidemia, unspecified: Secondary | ICD-10-CM

## 2017-10-14 LAB — POC MICROSCOPIC URINALYSIS (UMFC): Mucus: ABSENT

## 2017-10-14 LAB — POCT URINALYSIS DIP (MANUAL ENTRY)
Bilirubin, UA: NEGATIVE
Glucose, UA: NEGATIVE mg/dL
Ketones, POC UA: NEGATIVE mg/dL
Leukocytes, UA: NEGATIVE
Nitrite, UA: NEGATIVE
Protein Ur, POC: NEGATIVE mg/dL
Spec Grav, UA: 1.02 (ref 1.010–1.025)
Urobilinogen, UA: 0.2 E.U./dL
pH, UA: 5.5 (ref 5.0–8.0)

## 2017-10-14 MED ORDER — LEVOTHYROXINE SODIUM 112 MCG PO TABS: 112.0000 ug | ORAL_TABLET | Freq: Every day | ORAL | 5 refills | Status: DC

## 2017-10-14 MED ORDER — ATORVASTATIN CALCIUM 10 MG PO TABS: 10.0000 mg | ORAL_TABLET | Freq: Every day | ORAL | 5 refills | Status: DC

## 2017-10-14 MED ORDER — PROMETHAZINE HCL 25 MG PO TABS: 12.5000 mg | ORAL_TABLET | Freq: Three times a day (TID) | ORAL | 0 refills | Status: DC | PRN

## 2017-10-14 MED ORDER — OMEPRAZOLE 20 MG PO CPDR: 20.0000 mg | DELAYED_RELEASE_CAPSULE | Freq: Every day | ORAL | 3 refills | Status: DC

## 2017-10-14 NOTE — Progress Notes (Signed)
9/3/20192:45 PM  Sheri West 1960/04/02, 57 y.o. female [id number]  Chief Complaint  Patient presents with  . Diarrhea    Onset on Friday after eating a bowl of cereal. Also, having pain in the stomach    HPI:   Patient is a 57 y.o. female with past medical history significant for pre-diabetes, HLP and hypothyroidism who presents today for nausea and diarrhea  Diarrhea x 4 days, greenish, denies blood Started after eating cereal with normal milk, normally uses lactose free Today had 2 BM after starting immodium yesterday, but prior 4-6 per day Nausea but no vomiting Assoc w bloating and reflux. She has had worsening reflux for the past several weeks Normal UOP Tolerating fluids, but not eating Epigastric abd pain, cramping and stabbing No fevers but had chills first 2 days No known sick contacts No URI sx    Just started penicillin 500mg  once a day by dentist this morning  Lab Results  Component Value Date   HGBA1C 6.2 (H) 09/13/2016   Fall Risk  09/13/2016 05/01/2016 11/02/2015 10/17/2015  Falls in the past year? No No No No     Depression screen [facility] 2/9 09/13/2016 05/01/2016 11/02/2015  Decreased Interest 0 0 0  Down, Depressed, Hopeless 0 0 0  PHQ - 2 Score 0 0 0    No Known Allergies  Prior to Admission medications   Medication Sig Start Date End Date Taking? Authorizing Provider  atorvastatin (LIPITOR) 10 MG tablet Take 1 tablet (10 mg total) by mouth daily. OFFICE VISIT NEEDED FOR REFILLS 07/21/17  Yes Sagardia, Ines Bloomer, MD  levothyroxine (SYNTHROID, LEVOTHROID) 112 MCG tablet Take 1 tablet (112 mcg total) by mouth daily before breakfast. 09/13/16  Yes Wendie Agreste, MD    Past Medical History:  Diagnosis Date  . Allergy   . Thyroid disease     Past Surgical History:  Procedure Laterality Date  . REDUCTION MAMMAPLASTY      Social History   Tobacco Use  . Smoking status: Never Smoker  . Smokeless tobacco: Never Used  Substance Use Topics    . Alcohol use: No    Alcohol/week: 0.0 standard drinks    History reviewed. No pertinent family history.  ROS Per hpi  OBJECTIVE:  Blood pressure 117/77, pulse 65, temperature 98.6 F (37 C), temperature source Oral, height 5' 3.39" (1.61 m), weight 199 lb 12.8 oz (90.6 kg), SpO2 96 %. Body mass index is 34.96 kg/m.   Wt Readings from Last 3 Encounters:  10/14/17 199 lb 12.8 oz (90.6 kg)  09/13/16 204 lb 3.2 oz (92.6 kg)  05/01/16 205 lb (93 kg)    Physical Exam  Constitutional: She is oriented to person, place, and time. She appears well-developed and well-nourished. She has a sickly appearance.  HENT:  Head: Normocephalic and atraumatic.  Mouth/Throat: Mucous membranes are dry.  Eyes: Pupils are equal, round, and reactive to light. Conjunctivae and EOM are normal. No scleral icterus.  Neck: Neck supple.  Cardiovascular: Normal rate, regular rhythm and normal heart sounds. Exam reveals no gallop and no friction rub.  No murmur heard. Pulmonary/Chest: Effort normal and breath sounds normal. She has no wheezes. She has no rales.  Abdominal: Soft. Bowel sounds are normal. There is no hepatosplenomegaly. There is tenderness in the epigastric area. There is no rebound, no guarding and no CVA tenderness.  Musculoskeletal: She exhibits no edema.  Neurological: She is alert and oriented to person, place, and time.  Skin: Skin is warm  and dry.  Psychiatric: She has a normal mood and affect.  Nursing note and vitals reviewed.   Results for orders placed or performed in visit on 10/14/17 (from the past 24 hour(s))  POCT urinalysis dipstick     Status: Abnormal   Collection Time: 10/14/17  2:55 PM  Result Value Ref Range   Color, UA yellow yellow   Clarity, UA clear clear   Glucose, UA negative negative mg/dL   Bilirubin, UA negative negative   Ketones, POC UA negative negative mg/dL   Spec Grav, UA 1.020 1.010 - 1.025   Blood, UA moderate (A) negative   pH, UA 5.5 5.0 - 8.0    Protein Ur, POC negative negative mg/dL   Urobilinogen, UA 0.2 0.2 or 1.0 E.U./dL   Nitrite, UA Negative Negative   Leukocytes, UA Negative Negative  POCT Microscopic Urinalysis ([facility])     Status: Abnormal   Collection Time: 10/14/17  3:14 PM  Result Value Ref Range   WBC,UR,HPF,POC None None WBC/hpf   RBC,UR,HPF,POC Few (A) None RBC/hpf   Bacteria None None, Too numerous to count   Mucus Absent Absent   Epithelial Cells, UR Per Microscopy Few (A) None, Too numerous to count cells/hpf     ASSESSMENT and PLAN  1. Diarrhea, unspecified type 2. Nausea 3. Gastroesophageal reflux disease, esophagitis presence not specified Discussed supportive measures: push fluids, BRAT diet, immodium prn. Starting PPI.  RTC precautions given. Excusing from work for the next 2 days, she can call if it needs to be extended - CBC with Differential/Platelet - Comprehensive metabolic panel - H. pylori breath test - POCT Microscopic Urinalysis ([facility])  4. Hypothyroidism, unspecified type Checking labs today, medications will be adjusted as needed.  - TSH  5. Hyperlipidemia, unspecified hyperlipidemia type Checking labs today, medications will be adjusted as needed.  - Lipid panel  6. Prediabetes Checking labs today, medications will be adjusted as needed.  - Comprehensive metabolic panel - Hemoglobin A1c  Other orders - omeprazole (PRILOSEC) 20 MG capsule; Take 1 capsule (20 mg total) by mouth daily. - promethazine (PHENERGAN) 25 MG tablet; Take 0.5-1 tablets (12.5-25 mg total) by mouth every 8 (eight) hours as needed for nausea or vomiting.    Return in about 4 weeks (around 11/11/2017).    Rutherford Guys, MD [facility] [address], [place] [address] Ph.  [phone] Fax [phone]

## 2017-10-14 NOTE — Addendum Note (Signed)
Addended by: Rutherford Guys on: 10/14/2017 03:29 PM   Modules accepted: Orders

## 2017-10-14 NOTE — Patient Instructions (Addendum)
If you have lab work done today you will be contacted with your lab results within the next 2 weeks.  If you have not heard from [place] then please contact us. The fastest way to get your results is to register for My Chart.   IF you received an x-ray today, you will receive an invoice from [facility]. Please contact [facility] at [phone] with questions or concerns regarding your invoice.   IF you received labwork today, you will receive an invoice from [facility]. Please contact [facility] at [phone] with questions or concerns regarding your invoice.   Our billing staff will not be able to assist you with questions regarding bills from these companies.  You will be contacted with the lab results as soon as they are available. The fastest way to get your results is to activate your My Chart account. Instructions are located on the last page of this paperwork. If you have not heard from [place] regarding the results in 2 weeks, please contact this office.     Diarrea en los adultos (Diarrhea, Adult) La diarrea ocurre cuando la materia fecal (heces) es frecuentemente blanda y [place]. La diarrea puede hacerlo sentir dbil y causarle deshidratacin. La deshidratacin puede hacerlo sentir cansado y sediento, producirle sequedad en la boca y disminuir la frecuencia con la que [place]. La diarrea suele durar 2 o 3das. Sin embargo, puede durar ms tiempo si se trata de un signo de algo ms serio. Es importante tratar la diarrea como se lo haya indicado el mdico. CUIDADOS EN EL [employer] Comida y bebida Siga estas recomendaciones como se lo haya indicado el mdico:  Tome una solucin de rehidratacin oral (SRO). Es Ardelia Mems bebida que se vende en farmacias y tiendas.  Beba lquidos claros, por ejemplo: ? [place]. ? Cubitos de hielo. ? Jugo de frutas diluido. ? Bebidas deportivas de bajas caloras.  En la medida en que pueda, consuma alimentos blandos y fciles de digerir en pequeas  cantidades. Ellos son: ? Bananas. ? Pur de [employer]. ? Arroz. ? Carnes bajas en grasa Dorothea Glassman). ? Tostadas. ? Galletas.  Evite beber lquidos que contengan mucha azcar o cafena.  Evite el alcohol.  Evite los alimentos picantes o grasos. Instrucciones generales  Beba suficiente lquido para mantener el pis (orina) claro o de color amarillo plido.  Lvese las manos con frecuencia. Use un desinfectante para manos si no dispone de [place] y [place].  Asegrese de que todas las personas que viven en su casa se laven bien las manos y con frecuencia.  Tome los medicamentos de venta libre y los recetados solamente como se lo haya indicado el mdico.  Descanse en su casa hasta sentirse mejor.  Controle su afeccin para ver si hay cambios.  Tome un bao con agua tibia para Writer ardor o dolor a causa de la diarrea.  Concurra a todas las visitas de control como se lo haya indicado el mdico. Esto es importante. SOLICITE AYUDA SI:  Tiene fiebre.  La diarrea empeora.  Aparecen nuevos sntomas.  No puede retener los lquidos.  Se siente mareado o siente que va a desvanecerse.  Tiene dolores de [place].  Tiene calambres musculares. SOLICITE AYUDA DE INMEDIATO SI:  Siente dolor en el pecho.  Se siente muy dbil o se desvanece (se desmaya).  Tiene heces con sangre, de color negro o con aspecto alquitranado.  Tiene dolor muy intenso en el vientre (abdomen), clicos o meteorismo.  Marin Comment cuesta respirar o Bufford Lope  rpidamente.  Su corazn late muy rpidamente.  Siente la piel fra y hmeda.  Se siente confundido.  Tiene signos de deshidratacin, por ejemplo: ? [place] es [place], es muy escasa o no orina. ? Labios agrietados. ? [employer]. ? Ojos hundidos. ? Somnolencia. ? Debilidad. Esta informacin no tiene Marine scientist el consejo del mdico. Asegrese de hacerle al mdico cualquier pregunta que tenga. Document Released: 01/15/2012 Document Revised:  05/22/2015 Document Reviewed: 10/04/2014 Elsevier Interactive Patient Education  [employer].

## 2017-10-15 LAB — TSH: TSH: 38.19 u[IU]/mL — ABNORMAL HIGH (ref 0.450–4.500)

## 2017-10-15 LAB — CBC WITH DIFFERENTIAL/PLATELET
Basophils Absolute: 0 10*3/uL (ref 0.0–0.2)
Basos: 1 %
EOS (ABSOLUTE): 0.1 10*3/uL (ref 0.0–0.4)
Eos: 1 %
Hematocrit: 38.7 % (ref 34.0–46.6)
Hemoglobin: 12.8 g/dL (ref 11.1–15.9)
Immature Grans (Abs): 0 10*3/uL (ref 0.0–0.1)
Immature Granulocytes: 1 %
Lymphocytes Absolute: 1.5 10*3/uL (ref 0.7–3.1)
Lymphs: 35 %
MCH: 28.4 pg (ref 26.6–33.0)
MCHC: 33.1 g/dL (ref 31.5–35.7)
MCV: 86 fL (ref 79–97)
Monocytes Absolute: 0.5 10*3/uL (ref 0.1–0.9)
Monocytes: 11 %
Neutrophils Absolute: 2.1 10*3/uL (ref 1.4–7.0)
Neutrophils: 51 %
Platelets: 291 10*3/uL (ref 150–450)
RBC: 4.5 x10E6/uL (ref 3.77–5.28)
RDW: 15.1 % (ref 12.3–15.4)
WBC: 4.2 10*3/uL (ref 3.4–10.8)

## 2017-10-15 LAB — H. PYLORI BREATH TEST: H pylori Breath Test: POSITIVE — AB

## 2017-10-15 LAB — LIPID PANEL
Chol/HDL Ratio: 5.3 ratio — ABNORMAL HIGH (ref 0.0–4.4)
Cholesterol, Total: 197 mg/dL (ref 100–199)
HDL: 37 mg/dL — ABNORMAL LOW (ref >39)
LDL Calculated: 134 mg/dL — ABNORMAL HIGH (ref 0–99)
Triglycerides: 130 mg/dL (ref 0–149)
VLDL Cholesterol Cal: 26 mg/dL (ref 5–40)

## 2017-10-15 LAB — COMPREHENSIVE METABOLIC PANEL
ALT: 26 IU/L (ref 0–32)
AST: 23 IU/L (ref 0–40)
Albumin/Globulin Ratio: 1.4 (ref 1.2–2.2)
Albumin: 4.1 g/dL (ref 3.5–5.5)
Alkaline Phosphatase: 99 IU/L (ref 39–117)
BUN/Creatinine Ratio: 18 (ref 9–23)
BUN: 13 mg/dL (ref 6–24)
Bilirubin Total: 0.3 mg/dL (ref 0.0–1.2)
CO2: 22 mmol/L (ref 20–29)
Calcium: 9.1 mg/dL (ref 8.7–10.2)
Chloride: 104 mmol/L (ref 96–106)
Creatinine, Ser: 0.71 mg/dL (ref 0.57–1.00)
GFR calc Af Amer: 109 mL/min/{1.73_m2} (ref >59)
GFR calc non Af Amer: 94 mL/min/{1.73_m2} (ref >59)
Globulin, Total: 3 g/dL (ref 1.5–4.5)
Glucose: 103 mg/dL — ABNORMAL HIGH (ref 65–99)
Potassium: 4.1 mmol/L (ref 3.5–5.2)
Sodium: 143 mmol/L (ref 134–144)
Total Protein: 7.1 g/dL (ref 6.0–8.5)

## 2017-10-15 LAB — HEMOGLOBIN A1C
Est. average glucose Bld gHb Est-mCnc: 137 mg/dL
Hgb A1c MFr Bld: 6.4 % — ABNORMAL HIGH (ref 4.8–5.6)

## 2017-10-15 LAB — H. PYLORI BREATH COLLECTION

## 2017-10-15 MED ORDER — LEVOTHYROXINE SODIUM 137 MCG PO TABS: 137.0000 ug | ORAL_TABLET | Freq: Every day | ORAL | 2 refills | Status: DC

## 2017-10-15 MED ORDER — AMOXICILLIN 500 MG PO CAPS: 1000.0000 mg | ORAL_CAPSULE | Freq: Two times a day (BID) | ORAL | 0 refills | Status: AC

## 2017-10-15 MED ORDER — CLARITHROMYCIN 500 MG PO TABS: 500.0000 mg | ORAL_TABLET | Freq: Two times a day (BID) | ORAL | 0 refills | Status: AC

## 2017-10-15 NOTE — Addendum Note (Signed)
Addended by: Rutherford Guys on: 10/15/2017 10:18 PM   Modules accepted: Orders

## 2017-11-11 ENCOUNTER — Encounter: Payer: Self-pay | Admitting: Emergency Medicine

## 2017-11-11 ENCOUNTER — Ambulatory Visit (INDEPENDENT_AMBULATORY_CARE_PROVIDER_SITE_OTHER): Payer: Managed Care, Other (non HMO) | Admitting: Emergency Medicine

## 2017-11-11 ENCOUNTER — Other Ambulatory Visit: Payer: Self-pay

## 2017-11-11 VITALS — BP 106/72 | HR 64 | Temp 98.7°F | Resp 16 | Ht 63.75 in | Wt 199.4 lb

## 2017-11-11 DIAGNOSIS — Z1211 Encounter for screening for malignant neoplasm of colon: Secondary | ICD-10-CM | POA: Diagnosis not present

## 2017-11-11 DIAGNOSIS — R197 Diarrhea, unspecified: Secondary | ICD-10-CM | POA: Insufficient documentation

## 2017-11-11 DIAGNOSIS — Z23 Encounter for immunization: Secondary | ICD-10-CM | POA: Diagnosis not present

## 2017-11-11 DIAGNOSIS — A048 Other specified bacterial intestinal infections: Secondary | ICD-10-CM

## 2017-11-11 NOTE — Patient Instructions (Addendum)
If you have lab work done today you will be contacted with your lab results within the next 2 weeks.  If you have not heard from [place] then please contact us. The fastest way to get your results is to register for My Chart.   IF you received an x-ray today, you will receive an invoice from [facility] Radiology. Please contact [facility] Radiology at [phone] with questions or concerns regarding your invoice.   IF you received labwork today, you will receive an invoice from [facility]. Please contact [facility] at [phone] with questions or concerns regarding your invoice.   Our billing staff will not be able to assist you with questions regarding bills from these companies.  You will be contacted with the lab results as soon as they are available. The fastest way to get your results is to activate your My Chart account. Instructions are located on the last page of this paperwork. If you have not heard from [place] regarding the results in 2 weeks, please contact this office.     Infeccin por Helicobacter Pylori (Helicobacter Pylori Infection) La infeccin por Helicobacter pylori es una infeccin en el estmago que es causada por la bacteria Helicobacter pylori (H. pylori). Este tipo de bacteria vive frecuentemente en el revestimiento del estmago. La infeccin puede causar lceras e irritacin (gastritis) en algunas personas. Es la causa ms comn de lceras en el estmago (lcera gstrica) y en la parte superior del intestino (lcera duodenal). Tener esta infeccin tambin puede aumentar el riesgo de cncer de Paramedic y un tipo de cncer de los glbulos blancos (linfoma) que afecta al [place]. CAUSAS H. pylori es un tipo de bacteria que se encuentra frecuentemente en el estmago de las personas saludables. La bacteria puede pasar de Ardelia Mems persona a otra por contacto a travs de las heces o la saliva. No se sabe por qu algunas personas desarrollan lceras, gastritis o cncer a partir de la  infeccin. FACTORES DE RIESGO Es ms probable que esta afeccin se manifieste en las personas que:  Tienen familiares con esta infeccin.  Viven con [employer]; por ejemplo, en un dormitorio.  Son de origen africano, hispano o [place] con esta infeccin no tienen sntomas. Si tiene sntomas, estos pueden incluir los siguientes:  Merchant navy officer.  Dolor de [place].  Nuseas.  Vmitos.  Sangre en el vmito.  Prdida del apetito.  Mal aliento. DIAGNSTICO Esta afeccin se puede diagnosticar en funcin de los sntomas, un examen fsico y Pharmacist, community. Podrn solicitarle otros estudios, por ejemplo:  Anlisis de sangre o pruebas de materia fecal para verificar las protenas (anticuerpos) que el cuerpo puede producir en respuesta a las bacterias. Estas pruebas son la mejor manera de confirmar el diagnstico.  Ardelia Mems prueba de aliento para verificar el tipo de gas que la bacteria H. pylori libera despus de descomponer una sustancia llamada urea. Para la prueba, se le pide que beba urea. Esta prueba se realiza frecuentemente despus del tratamiento para saber si el tratamiento funcion.  Un procedimiento en el que un tubo delgado y flexible con una pequea cmara en el extremo se coloca en el estmago y el intestino superior (endoscopia superior). El mdico tambin puede tomar muestras de tejido (biopsia) para Optometrist pruebas de H. pylori y cncer. TRATAMIENTO El tratamiento para esta afeccin generalmente implica tomar una combinacin de medicamentos (terapia triple) durante un par de semanas. La terapia triple incluye un medicamento para reducir Scientific laboratory technician  estmago y dos tipos de antibiticos. Se aprobaron muchas combinaciones de frmacos para el tratamiento. El tratamiento generalmente mata a la H. pylori y reduce el riesgo de cncer. Despus del tratamiento, es posible que deba volver a realizarse una prueba de H. pylori. En  algunos casos, es posible que sea necesario repetir Dispensing optician. [place] los medicamentos de venta libre y los recetados solamente como se lo haya indicado el mdico.  Tome los antibiticos como se lo haya indicado el mdico. No deje de tomar los antibiticos aunque comience a sentirse mejor.  Puede retomar todas sus actividades habituales y Clinical biochemist su dieta habitual.  Tome estas medidas para evitar infecciones futuras: ? Lvese las manos con frecuencia. ? Asegrese de que los alimentos que consume se hayan preparado adecuadamente. ? Beba agua solamente de fuentes limpias.  Concurra a todas las visitas de control como se lo haya indicado el mdico. Esto es importante. SOLICITE ATENCIN MDICA SI:  Los sntomas no mejoran.  Los sntomas regresan despus del tratamiento. Esta informacin no tiene Marine scientist el consejo del mdico. Asegrese de hacerle al mdico cualquier pregunta que tenga. Document Released: 05/22/2015 Document Revised: 05/22/2015 Document Reviewed: 02/09/2014 Elsevier Interactive Patient Education  2018 [employer].

## 2017-11-11 NOTE — Progress Notes (Signed)
Sheri West 57 y.o.   Chief Complaint  Patient presents with  . Diarrhea    FOLLOW UP office visit 10/14/2017 with Dr Pamella Pert    HISTORY OF PRESENT ILLNESS: This is a 57 y.o. female seen here by Dr. Pamella Pert on 10/14/2017 with diarrhea.  H. pylori test came back positive.  Started on medication.  Patient still taking it.  Diarrhea much improved.  No longer nauseous.  Here today for follow-up.  HPI   Prior to Admission medications   Medication Sig Start Date End Date Taking? Authorizing Provider  atorvastatin (LIPITOR) 10 MG tablet Take 1 tablet (10 mg total) by mouth daily. 10/14/17  Yes Rutherford Guys, MD  levothyroxine (SYNTHROID, LEVOTHROID) 137 MCG tablet Take 1 tablet (137 mcg total) by mouth daily before breakfast. 10/15/17  Yes Rutherford Guys, MD  omeprazole (PRILOSEC) 20 MG capsule Take 1 capsule (20 mg total) by mouth daily. 10/14/17  Yes Rutherford Guys, MD  promethazine (PHENERGAN) 25 MG tablet Take 0.5-1 tablets (12.5-25 mg total) by mouth every 8 (eight) hours as needed for nausea or vomiting. Patient not taking: Reported on 11/11/2017 10/14/17   Rutherford Guys, MD    No Known Allergies  Patient Active Problem List   Diagnosis Date Noted  . H. pylori infection 11/11/2017  . Diarrhea 11/11/2017  . Hypothyroidism 10/17/2015    Past Medical History:  Diagnosis Date  . Allergy   . Thyroid disease     Past Surgical History:  Procedure Laterality Date  . REDUCTION MAMMAPLASTY      Social History   Socioeconomic History  . Marital status: Married    Spouse name: Not on file  . Number of children: Not on file  . Years of education: Not on file  . Highest education level: Not on file  Occupational History  . Not on file  Social Needs  . Financial resource strain: Not on file  . Food insecurity:    Worry: Not on file    Inability: Not on file  . Transportation needs:    Medical: Not on file    Non-medical: Not on file  Tobacco Use  . Smoking  status: Never Smoker  . Smokeless tobacco: Never Used  Substance and Sexual Activity  . Alcohol use: No    Alcohol/week: 0.0 standard drinks  . Drug use: No  . Sexual activity: Not on file  Lifestyle  . Physical activity:    Days per week: Not on file    Minutes per session: Not on file  . Stress: Not on file  Relationships  . Social connections:    Talks on phone: Not on file    Gets together: Not on file    Attends religious service: Not on file    Active member of club or organization: Not on file    Attends meetings of clubs or organizations: Not on file    Relationship status: Not on file  . Intimate partner violence:    Fear of current or ex partner: Not on file    Emotionally abused: Not on file    Physically abused: Not on file    Forced sexual activity: Not on file  Other Topics Concern  . Not on file  Social History Narrative  . Not on file    History reviewed. No pertinent family history.   Review of Systems  Constitutional: Negative.  Negative for chills, fever and weight loss.  HENT: Negative.  Negative for sore throat.  Eyes: Negative.  Negative for blurred vision and double vision.  Respiratory: Negative.  Negative for cough and shortness of breath.   Cardiovascular: Negative.  Negative for chest pain and palpitations.  Gastrointestinal: Negative.  Negative for abdominal pain, blood in stool, diarrhea, melena, nausea and vomiting.  Genitourinary: Negative.  Negative for dysuria.  Musculoskeletal: Negative.   Skin: Negative.  Negative for rash.  Neurological: Negative.  Negative for dizziness and headaches.  Endo/Heme/Allergies: Negative.   All other systems reviewed and are negative.  Vitals:   11/11/17 0920  BP: 106/72  Pulse: 64  Resp: 16  Temp: 98.7 F (37.1 C)  SpO2: 98%     Physical Exam  Constitutional: She is oriented to person, place, and time. She appears well-developed and well-nourished.  HENT:  Head: Normocephalic and  atraumatic.  Nose: Nose normal.  Mouth/Throat: Oropharynx is clear and moist.  Eyes: Pupils are equal, round, and reactive to light. Conjunctivae and EOM are normal.  Neck: Normal range of motion. Neck supple.  Cardiovascular: Normal rate and regular rhythm.  Pulmonary/Chest: Effort normal and breath sounds normal.  Abdominal: Soft. Bowel sounds are normal.  Musculoskeletal: Normal range of motion.  Neurological: She is alert and oriented to person, place, and time. No sensory deficit. She exhibits normal muscle tone.  Skin: Skin is warm and dry. Capillary refill takes less than 2 seconds. No rash noted.  Psychiatric: She has a normal mood and affect. Her behavior is normal.  Vitals reviewed.  A total of 25 minutes was spent in the room with the patient, greater than 50% of which was in counseling/coordination of care regarding diagnosis, treatment, medications, and need for follow-up.   ASSESSMENT & PLAN: Sheri West was seen today for diarrhea.  Diagnoses and all orders for this visit:  H. pylori infection  Need for prophylactic vaccination and inoculation against influenza -     Flu Vaccine QUAD 36+ mos IM -     Cancel: Pneumococcal polysaccharide vaccine 23-valent greater than or equal to 2yo subcutaneous/IM  Need for prophylactic vaccination against Streptococcus pneumoniae (pneumococcus)  Diarrhea, unspecified type Comments: improved    Patient Instructions       If you have lab work done today you will be contacted with your lab results within the next 2 weeks.  If you have not heard from [place] then please contact us. The fastest way to get your results is to register for My Chart.   IF you received an x-ray today, you will receive an invoice from [facility]. Please contact [facility] at [phone] with questions or concerns regarding your invoice.   IF you received labwork today, you will receive an invoice from [place]. Please contact [facility]  at [phone] with questions or concerns regarding your invoice.   Our billing staff will not be able to assist you with questions regarding bills from these companies.  You will be contacted with the lab results as soon as they are available. The fastest way to get your results is to activate your My Chart account. Instructions are located on the last page of this paperwork. If you have not heard from [place] regarding the results in 2 weeks, please contact this office.     Infeccin por Helicobacter Pylori (Helicobacter Pylori Infection) La infeccin por Helicobacter pylori es una infeccin en el estmago que es causada por la bacteria Helicobacter pylori (H. pylori). Este tipo de bacteria vive frecuentemente en el revestimiento del estmago. La infeccin puede causar lceras e irritacin (gastritis)  en [employer]. Es la causa ms comn de lceras en el estmago (lcera gstrica) y en la parte superior del intestino (lcera duodenal). Tener esta infeccin tambin puede aumentar el riesgo de cncer de Paramedic y un tipo de cncer de los glbulos blancos (linfoma) que afecta al [place]. CAUSAS H. pylori es un tipo de bacteria que se encuentra frecuentemente en el estmago de las personas saludables. La bacteria puede pasar de Ardelia Mems persona a otra por contacto a travs de las heces o la saliva. No se sabe por qu algunas personas desarrollan lceras, gastritis o cncer a partir de la infeccin. FACTORES DE RIESGO Es ms probable que esta afeccin se manifieste en las personas que:  Tienen familiares con esta infeccin.  Viven con [employer]; por ejemplo, en un dormitorio.  Son de origen africano, hispano o [place] con esta infeccin no tienen sntomas. Si tiene sntomas, estos pueden incluir los siguientes:  Merchant navy officer.  Dolor de [place].  Nuseas.  Vmitos.  Sangre en el vmito.  Prdida del apetito.  Mal  aliento. DIAGNSTICO Esta afeccin se puede diagnosticar en funcin de los sntomas, un examen fsico y Pharmacist, community. Podrn solicitarle otros estudios, por ejemplo:  Anlisis de sangre o pruebas de materia fecal para verificar las protenas (anticuerpos) que el cuerpo puede producir en respuesta a las bacterias. Estas pruebas son la mejor manera de confirmar el diagnstico.  Ardelia Mems prueba de aliento para verificar el tipo de gas que la bacteria H. pylori libera despus de descomponer una sustancia llamada urea. Para la prueba, se le pide que beba urea. Esta prueba se realiza frecuentemente despus del tratamiento para saber si el tratamiento funcion.  Un procedimiento en el que un tubo delgado y flexible con una pequea cmara en el extremo se coloca en el estmago y el intestino superior (endoscopia superior). El mdico tambin puede tomar muestras de tejido (biopsia) para Optometrist pruebas de H. pylori y cncer. TRATAMIENTO El tratamiento para esta afeccin generalmente implica tomar una combinacin de medicamentos (terapia triple) durante un par de semanas. La terapia triple incluye un medicamento para reducir el cido en el estmago y dos tipos de antibiticos. Se aprobaron muchas combinaciones de frmacos para el tratamiento. El tratamiento generalmente mata a la H. pylori y reduce el riesgo de cncer. Despus del tratamiento, es posible que deba volver a realizarse una prueba de H. pylori. En algunos casos, es posible que sea necesario repetir Dispensing optician. [place] los medicamentos de venta libre y los recetados solamente como se lo haya indicado el mdico.  Tome los antibiticos como se lo haya indicado el mdico. No deje de tomar los antibiticos aunque comience a sentirse mejor.  Puede retomar todas sus actividades habituales y Clinical biochemist su dieta habitual.  Tome estas medidas para evitar infecciones futuras: ? Lvese las manos con  frecuencia. ? Asegrese de que los alimentos que consume se hayan preparado adecuadamente. ? Beba agua solamente de fuentes limpias.  Concurra a todas las visitas de control como se lo haya indicado el mdico. Esto es importante. SOLICITE ATENCIN MDICA SI:  Los sntomas no mejoran.  Los sntomas regresan despus del tratamiento. Esta informacin no tiene Marine scientist el consejo del mdico. Asegrese de hacerle al mdico cualquier pregunta que tenga. Document Released: 05/22/2015 Document Revised: 05/22/2015 Document Reviewed: 02/09/2014 Elsevier Interactive Patient Education  2018 Elsevier Inc.      Agustina Caroli, MD [facility]

## 2018-07-29 ENCOUNTER — Ambulatory Visit: Payer: Managed Care, Other (non HMO) | Admitting: *Deleted

## 2018-07-29 ENCOUNTER — Other Ambulatory Visit: Payer: Self-pay

## 2018-07-29 VITALS — Ht 64.0 in | Wt 208.0 lb

## 2018-07-29 DIAGNOSIS — Z1211 Encounter for screening for malignant neoplasm of colon: Secondary | ICD-10-CM

## 2018-07-29 MED ORDER — SUPREP BOWEL PREP KIT 17.5-3.13-1.6 GM/177ML PO SOLN: 1.0000 | Freq: Once | ORAL | 0 refills | Status: AC

## 2018-07-29 NOTE — Progress Notes (Signed)
No egg or soy allergy known to patient  No issues with past sedation with any surgeries  or procedures, no intubation problems  No diet pills per patient No home 02 use per patient  No blood thinners per patient  Pt denies issues with constipation  No A fib or A flutter  EMMI video sent to pt's e mail  Husband on speaker helped with any translation needed today for PV_ Mailed instructions in [place] and Spanish at their request   Pt verified name, DOB, address and insurance during PV today. Pt mailed instruction packet to included paper to complete and mail back to [facility] with addressed and stamped envelope, Emmi video, copy of consent form to read and not return, and instructions. Suprep $15  coupon mailed in packet. PV completed over the phone. Pt encouraged to call with questions or issues   Pt is aware that care partner will wait in the car during parking lot; if they feel like they will be too hot to wait in the car; they may wait in the lobby.  We want them to wear a mask (we do not have any that we can provide them), practice social distancing, and we will check their temperatures when they get here.  I did remind patient that their care partner needs to stay in the parking lot the entire time. Pt will wear mask into building

## 2018-08-10 ENCOUNTER — Encounter: Payer: Self-pay | Admitting: Gastroenterology

## 2018-08-18 ENCOUNTER — Telehealth: Payer: Self-pay | Admitting: Gastroenterology

## 2018-08-18 NOTE — Telephone Encounter (Signed)

## 2018-08-19 ENCOUNTER — Encounter: Payer: Self-pay | Admitting: Gastroenterology

## 2018-08-19 ENCOUNTER — Ambulatory Visit (AMBULATORY_SURGERY_CENTER): Payer: Managed Care, Other (non HMO) | Admitting: Gastroenterology

## 2018-08-19 ENCOUNTER — Other Ambulatory Visit: Payer: Self-pay

## 2018-08-19 VITALS — BP 97/61 | HR 56 | Temp 98.2°F | Resp 20 | Ht 63.75 in | Wt 199.0 lb

## 2018-08-19 DIAGNOSIS — K635 Polyp of colon: Secondary | ICD-10-CM

## 2018-08-19 DIAGNOSIS — Z1211 Encounter for screening for malignant neoplasm of colon: Secondary | ICD-10-CM | POA: Diagnosis present

## 2018-08-19 DIAGNOSIS — D123 Benign neoplasm of transverse colon: Secondary | ICD-10-CM

## 2018-08-19 MED ORDER — SODIUM CHLORIDE 0.9 % IV SOLN: 500.0000 mL | Freq: Once | INTRAVENOUS | Status: DC

## 2018-08-19 NOTE — Patient Instructions (Signed)
YOU HAD AN ENDOSCOPIC PROCEDURE TODAY AT [facility]:   Refer to the procedure report that was given to you for any specific questions about what was found during the examination.  If the procedure report does not answer your questions, please call your gastroenterologist to clarify.  If you requested that your care partner not be given the details of your procedure findings, then the procedure report has been included in a sealed envelope for you to review at your convenience later.  YOU SHOULD EXPECT: Some feelings of bloating in the abdomen. Passage of more gas than usual.  Walking can help get rid of the air that was put into your GI tract during the procedure and reduce the bloating. If you had a lower endoscopy (such as a colonoscopy or flexible sigmoidoscopy) you may notice spotting of blood in your stool or on the toilet paper. If you underwent a bowel prep for your procedure, you may not have a normal bowel movement for a few days.  Please Note:  You might notice some irritation and congestion in your nose or some drainage.  This is from the oxygen used during your procedure.  There is no need for concern and it should clear up in a day or so.  SYMPTOMS TO REPORT IMMEDIATELY:   Following lower endoscopy (colonoscopy or flexible sigmoidoscopy):  Excessive amounts of blood in the stool  Significant tenderness or worsening of abdominal pains  Swelling of the abdomen that is new, acute  Fever of 100F or higher  For urgent or emergent issues, a gastroenterologist can be reached at any hour by calling [phone].  DIET:  We do recommend a small meal at first, but then you may proceed to your regular diet.  Drink plenty of fluids but you should avoid alcoholic beverages for 24 hours.  ACTIVITY:  You should plan to take it easy for the rest of today and you should NOT DRIVE or use heavy machinery until tomorrow (because of the sedation medicines used during the test).     FOLLOW UP: Our staff will call the number listed on your records 48-72 hours following your procedure to check on you and address any questions or concerns that you may have regarding the information given to you following your procedure. If we do not reach you, we will leave a message.  We will attempt to reach you two times.  During this call, we will ask if you have developed any symptoms of COVID 19. If you develop any symptoms (ie: fever, flu-like symptoms, shortness of breath, cough etc.) before then, please call [phone].  If you test positive for Covid 19 in the 2 weeks post procedure, please call and report this information to [place].    If any biopsies were taken you will be contacted by phone or by letter within the next 1-3 weeks.  Please call us at [phone] if you have not heard about the biopsies in 3 weeks.    SIGNATURES/CONFIDENTIALITY: You and/or your care partner have signed paperwork which will be entered into your electronic medical record.  These signatures attest to the fact that that the information above on your After Visit Summary has been reviewed and is understood.  Full responsibility of the confidentiality of this discharge information lies with you and/or your care-partner.  Await pathology  Please read over handouts about polyps, diverticulosis and hemorrhoids  Please continue your normal medications  Next colonoscopy in 5-10 years

## 2018-08-19 NOTE — Progress Notes (Signed)
Pt's states no medical or surgical changes since previsit or office visit. 

## 2018-08-19 NOTE — Progress Notes (Signed)
[place]

## 2018-08-19 NOTE — Progress Notes (Signed)
A/ox3, pleased with MAC, report to RN 

## 2018-08-19 NOTE — Op Note (Signed)
[facility] Patient Name: Sheri West Procedure Date: 08/19/2018 1:59 PM MRN: [id number] Endoscopist: Mauri Pole , MD Age: 58 Referring MD:  Date of Birth: [birth year] Gender: Female Account #: [id number] Procedure:                Colonoscopy Indications:              Screening for colorectal malignant neoplasm Medicines:                Monitored Anesthesia Care Procedure:                Pre-Anesthesia Assessment:                           - Prior to the procedure, a History and Physical                            was performed, and patient medications and                            allergies were reviewed. The patient's tolerance of                            previous anesthesia was also reviewed. The risks                            and benefits of the procedure and the sedation                            options and risks were discussed with the patient.                            All questions were answered, and informed consent                            was obtained. Prior Anticoagulants: The patient has                            taken no previous anticoagulant or antiplatelet                            agents. ASA Grade Assessment: II - A patient with                            mild systemic disease. After reviewing the risks                            and benefits, the patient was deemed in                            satisfactory condition to undergo the procedure.                           After obtaining informed consent, the colonoscope  was passed under direct vision. Throughout the                            procedure, the patient's blood pressure, pulse, and                            oxygen saturations were monitored continuously. The                            Colonoscope was introduced through the anus and                            advanced to the the cecum, identified by                            appendiceal  orifice and ileocecal valve. The                            colonoscopy was performed without difficulty. The                            patient tolerated the procedure well. The quality                            of the bowel preparation was excellent. The                            ileocecal valve, appendiceal orifice, and rectum                            were photographed. Scope In: 2:08:44 PM Scope Out: 2:23:13 PM Scope Withdrawal Time: 0 hours 7 minutes 28 seconds  Total Procedure Duration: 0 hours 14 minutes 29 seconds  Findings:                 The perianal and digital rectal examinations were                            normal.                           Two sessile polyps were found in the transverse                            colon. The polyps were 4 to 6 mm in size. These                            polyps were removed with a cold snare. Resection                            and retrieval were complete.                           Scattered small and large-mouthed diverticula were  found in the sigmoid colon, ascending colon and                            cecum.                           Non-bleeding internal hemorrhoids were found during                            retroflexion. The hemorrhoids were small. Complications:            No immediate complications. Estimated Blood Loss:     Estimated blood loss was minimal. Impression:               - Two 4 to 6 mm polyps in the transverse colon,                            removed with a cold snare. Resected and retrieved.                           - Diverticulosis in the sigmoid colon, in the                            ascending colon and in the cecum.                           - Non-bleeding internal hemorrhoids. Recommendation:           - Patient has a contact number available for                            emergencies. The signs and symptoms of potential                            delayed complications  were discussed with the                            patient. Return to normal activities tomorrow.                            Written discharge instructions were provided to the                            patient.                           - Resume previous diet.                           - Continue present medications.                           - Await pathology results.                           - Repeat colonoscopy in 5-10 years for surveillance  based on pathology results. Mauri Pole, MD 08/19/2018 2:28:02 PM This report has been signed electronically.

## 2018-08-21 ENCOUNTER — Telehealth: Payer: Self-pay | Admitting: *Deleted

## 2018-08-21 NOTE — Telephone Encounter (Signed)
  Follow up Call-  Call back number 08/19/2018  Post procedure Call Back phone  # [phone]  Permission to leave phone message Yes  Some recent data might be hidden     Patient questions:  Do you have a fever, pain , or abdominal swelling? No. Pain Score  0 *  Have you tolerated food without any problems? Yes.    Have you been able to return to your normal activities? Yes.    Do you have any questions about your discharge instructions: Diet   No. Medications  No. Follow up visit  No.  Do you have questions or concerns about your Care? No.  Actions: * If pain score is 4 or above: No action needed, pain <4.  1. Have you developed a fever since your procedure? NO  2.   Have you had an respiratory symptoms (SOB or cough) since your procedure? NO  3.   Have you tested positive for COVID 19 since your procedure NO  4.   Have you had any family members/close contacts diagnosed with the COVID 19 since your procedure?  NO   If yes to any of these questions please route to Joylene John, RN and Alphonsa Gin, RN.

## 2018-09-01 ENCOUNTER — Encounter: Payer: Self-pay | Admitting: Gastroenterology

## 2018-10-11 ENCOUNTER — Emergency Department (HOSPITAL_COMMUNITY)
Admission: EM | Admit: 2018-10-11 | Discharge: 2018-10-11 | Disposition: A | Discharge status: Home / Self Care | Payer: Managed Care, Other (non HMO) | Attending: Emergency Medicine | Admitting: Emergency Medicine

## 2018-10-11 ENCOUNTER — Other Ambulatory Visit: Payer: Self-pay

## 2018-10-11 ENCOUNTER — Emergency Department (HOSPITAL_COMMUNITY): Payer: Managed Care, Other (non HMO)

## 2018-10-11 ENCOUNTER — Encounter (HOSPITAL_COMMUNITY): Payer: Self-pay | Admitting: Emergency Medicine

## 2018-10-11 DIAGNOSIS — R509 Fever, unspecified: Secondary | ICD-10-CM | POA: Diagnosis not present

## 2018-10-11 DIAGNOSIS — Z20828 Contact with and (suspected) exposure to other viral communicable diseases: Secondary | ICD-10-CM | POA: Insufficient documentation

## 2018-10-11 DIAGNOSIS — Z79899 Other long term (current) drug therapy: Secondary | ICD-10-CM | POA: Insufficient documentation

## 2018-10-11 DIAGNOSIS — R109 Unspecified abdominal pain: Secondary | ICD-10-CM | POA: Diagnosis not present

## 2018-10-11 DIAGNOSIS — Z87891 Personal history of nicotine dependence: Secondary | ICD-10-CM | POA: Diagnosis not present

## 2018-10-11 DIAGNOSIS — R311 Benign essential microscopic hematuria: Secondary | ICD-10-CM

## 2018-10-11 LAB — URINALYSIS, ROUTINE W REFLEX MICROSCOPIC
Bilirubin Urine: NEGATIVE
Glucose, UA: NEGATIVE mg/dL
Ketones, ur: NEGATIVE mg/dL
Nitrite: NEGATIVE
Protein, ur: 100 mg/dL — AB
Specific Gravity, Urine: 1.014 (ref 1.005–1.030)
WBC, UA: >50 WBC/hpf — ABNORMAL HIGH (ref 0–5)
pH: 6 (ref 5.0–8.0)

## 2018-10-11 LAB — CBC
HCT: 42.5 % (ref 36.0–46.0)
Hemoglobin: 13.3 g/dL (ref 12.0–15.0)
MCH: 29.7 pg (ref 26.0–34.0)
MCHC: 31.3 g/dL (ref 30.0–36.0)
MCV: 94.9 fL (ref 80.0–100.0)
Platelets: 223 10*3/uL (ref 150–400)
RBC: 4.48 MIL/uL (ref 3.87–5.11)
RDW: 13.9 % (ref 11.5–15.5)
WBC: 16 10*3/uL — ABNORMAL HIGH (ref 4.0–10.5)
nRBC: 0 % (ref 0.0–0.2)

## 2018-10-11 LAB — COMPREHENSIVE METABOLIC PANEL
ALT: 24 U/L (ref 0–44)
AST: 19 U/L (ref 15–41)
Albumin: 3.6 g/dL (ref 3.5–5.0)
Alkaline Phosphatase: 83 U/L (ref 38–126)
Anion gap: 14 (ref 5–15)
BUN: 18 mg/dL (ref 6–20)
CO2: 23 mmol/L (ref 22–32)
Calcium: 9 mg/dL (ref 8.9–10.3)
Chloride: 103 mmol/L (ref 98–111)
Creatinine, Ser: 1.01 mg/dL — ABNORMAL HIGH (ref 0.44–1.00)
GFR calc Af Amer: >60 mL/min (ref >60)
GFR calc non Af Amer: >60 mL/min (ref >60)
Glucose, Bld: 147 mg/dL — ABNORMAL HIGH (ref 70–99)
Potassium: 4.2 mmol/L (ref 3.5–5.1)
Sodium: 140 mmol/L (ref 135–145)
Total Bilirubin: 0.7 mg/dL (ref 0.3–1.2)
Total Protein: 6.4 g/dL — ABNORMAL LOW (ref 6.5–8.1)

## 2018-10-11 LAB — I-STAT BETA HCG BLOOD, ED (MC, WL, AP ONLY): I-stat hCG, quantitative: <5 m[IU]/mL (ref <5)

## 2018-10-11 LAB — LIPASE, BLOOD: Lipase: 33 U/L (ref 11–51)

## 2018-10-11 MED ORDER — SODIUM CHLORIDE 0.9% FLUSH: 3.0000 mL | Freq: Once | INTRAVENOUS | Status: DC

## 2018-10-11 MED ORDER — CEPHALEXIN 500 MG PO CAPS: 500.0000 mg | ORAL_CAPSULE | Freq: Four times a day (QID) | ORAL | 0 refills | Status: DC

## 2018-10-11 MED ORDER — OXYCODONE HCL 5 MG PO TABS
5.0000 mg | ORAL_TABLET | Freq: Once | ORAL | Status: AC
  Administered 2018-10-11: 08:00:00 5 mg via ORAL
  Filled 2018-10-11: qty 1

## 2018-10-11 MED ORDER — ACETAMINOPHEN 500 MG PO TABS
1000.0000 mg | ORAL_TABLET | Freq: Once | ORAL | Status: AC
  Administered 2018-10-11: 1000 mg via ORAL
  Filled 2018-10-11: qty 2

## 2018-10-11 MED ORDER — SODIUM CHLORIDE 0.9 % IV BOLUS: 1000.0000 mL | Freq: Once | INTRAVENOUS | Status: DC

## 2018-10-11 NOTE — ED Provider Notes (Signed)
[facility] EMERGENCY DEPARTMENT Provider Note   CSN: [id number] Arrival date & time: 10/11/18  0522     History   Chief Complaint Chief Complaint  Patient presents with   Back Pain   Fever    HPI Sheri West is a 58 y.o. female.     58 yo F with a cc of fever and R flank pain.  Going on since yesterday afternoon. Having fevers, chills.  No cough, sob.  No urinary symptoms.  No abdominal pain.  Decreased appetite.  Pain not worse with eating.  Nausea without vomiting.  No trauma.   The history is provided by the patient.  Back Pain Associated symptoms: fever   Associated symptoms: no abdominal pain, no chest pain, no dysuria and no headaches   Fever Associated symptoms: myalgias and nausea   Associated symptoms: no chest pain, no chills, no congestion, no dysuria, no headaches, no rhinorrhea and no vomiting   Flank Pain This is a new problem. The current episode started yesterday. The problem occurs constantly. The problem has been gradually worsening. Pertinent negatives include no chest pain, no abdominal pain, no headaches and no shortness of breath. Nothing aggravates the symptoms. Nothing relieves the symptoms. She has tried nothing for the symptoms. The treatment provided no relief.    Past Medical History:  Diagnosis Date   Allergy    GERD (gastroesophageal reflux disease)    Hyperlipidemia    Thyroid disease     Patient Active Problem List   Diagnosis Date Noted   H. pylori infection 11/11/2017   Diarrhea 11/11/2017   Hypothyroidism 10/17/2015    Past Surgical History:  Procedure Laterality Date   FOOT SURGERY     REDUCTION MAMMAPLASTY     SHOULDER SURGERY       OB History   No obstetric history on file.      Home Medications    Prior to Admission medications   Medication Sig Start Date End Date Taking? Authorizing Provider  atorvastatin (LIPITOR) 10 MG tablet Take 1 tablet (10 mg total) by mouth  daily. Patient not taking: Reported on 07/29/2018 10/14/17   Rutherford Guys, MD  cephALEXin (KEFLEX) 500 MG capsule Take 1 capsule (500 mg total) by mouth 4 (four) times daily. 10/11/18   Deno Etienne, DO  levothyroxine (SYNTHROID) 112 MCG tablet  05/11/18   [provider]  levothyroxine (SYNTHROID, LEVOTHROID) 137 MCG tablet Take 1 tablet (137 mcg total) by mouth daily before breakfast. 10/15/17   Rutherford Guys, MD  omeprazole (PRILOSEC) 20 MG capsule Take 1 capsule (20 mg total) by mouth daily. Patient not taking: Reported on 08/19/2018 10/14/17   Rutherford Guys, MD  promethazine (PHENERGAN) 25 MG tablet Take 0.5-1 tablets (12.5-25 mg total) by mouth every 8 (eight) hours as needed for nausea or vomiting. Patient not taking: Reported on 11/11/2017 10/14/17   Rutherford Guys, MD    Family History Family History  Problem Relation Age of Onset   Colon cancer Neg Hx    Colon polyps Neg Hx    Esophageal cancer Neg Hx    Rectal cancer Neg Hx    Stomach cancer Neg Hx     Social History Social History   Tobacco Use   Smoking status: Former Smoker   Smokeless tobacco: Never Used   Tobacco comment: quit 20 yrs ago   Substance Use Topics   Alcohol use: No    Alcohol/week: 0.0 standard drinks   Drug use:  No     Allergies   Patient has no known allergies.   Review of Systems Review of Systems  Constitutional: Positive for fever. Negative for chills.  HENT: Negative for congestion and rhinorrhea.   Eyes: Negative for redness and visual disturbance.  Respiratory: Negative for shortness of breath and wheezing.   Cardiovascular: Negative for chest pain and palpitations.  Gastrointestinal: Positive for nausea. Negative for abdominal pain and vomiting.  Genitourinary: Positive for flank pain. Negative for dysuria, frequency and urgency.  Musculoskeletal: Positive for back pain and myalgias. Negative for arthralgias.  Skin: Negative for pallor and wound.  Neurological:  Negative for dizziness and headaches.     Physical Exam Updated Vital Signs BP (!) 142/78 (BP Location: Right Arm)    Pulse (!) 114    Temp (!) 100.5 F (38.1 C) (Oral)    Resp (!) 26    Ht 5\' 4"  (1.626 m)    Wt 95.3 kg    SpO2 98%    BMI 36.05 kg/m   Physical Exam Vitals signs and nursing note reviewed.  Constitutional:      General: She is not in acute distress.    Appearance: She is well-developed. She is not diaphoretic.  HENT:     Head: Normocephalic and atraumatic.  Eyes:     Pupils: Pupils are equal, round, and reactive to light.  Neck:     Musculoskeletal: Normal range of motion and neck supple.  Cardiovascular:     Rate and Rhythm: Normal rate and regular rhythm.     Heart sounds: No murmur. No friction rub. No gallop.   Pulmonary:     Effort: Pulmonary effort is normal.     Breath sounds: No wheezing or rales.  Abdominal:     General: There is no distension.     Palpations: Abdomen is soft.     Tenderness: There is no abdominal tenderness.  Musculoskeletal:        General: Tenderness present.     Comments: Mild tenderness to bilateral cva  Skin:    General: Skin is warm and dry.  Neurological:     Mental Status: She is alert and oriented to person, place, and time.  Psychiatric:        Behavior: Behavior normal.      ED Treatments / Results  Labs (all labs ordered are listed, but only abnormal results are displayed) Labs Reviewed  COMPREHENSIVE METABOLIC PANEL - Abnormal; Notable for the following components:      Result Value   Glucose, Bld 147 (*)    Creatinine, Ser 1.01 (*)    Total Protein 6.4 (*)    All other components within normal limits  CBC - Abnormal; Notable for the following components:   WBC 16.0 (*)    All other components within normal limits  URINALYSIS, ROUTINE W REFLEX MICROSCOPIC - Abnormal; Notable for the following components:   APPearance HAZY (*)    Hgb urine dipstick MODERATE (*)    Protein, ur 100 (*)    Leukocytes,Ua  SMALL (*)    WBC, UA >50 (*)    Bacteria, UA RARE (*)    All other components within normal limits  NOVEL CORONAVIRUS, NAA (HOSP ORDER, SEND-OUT TO REF LAB; TAT 18-24 HRS)  LIPASE, BLOOD  I-STAT BETA HCG BLOOD, ED ([facility], [facility], [facility] ONLY)    EKG None  Radiology Dg Chest Port 1 View  Result Date: 10/11/2018 CLINICAL DATA:  Fever and back pain. EXAM: PORTABLE CHEST 1  VIEW COMPARISON:  09/27/2012 FINDINGS: Midline trachea. Borderline cardiomegaly. Tortuous thoracic aorta. No pleural effusion or pneumothorax. Clear lungs. IMPRESSION: No active disease. Electronically Signed   By: Abigail Miyamoto M.D.   On: 10/11/2018 08:05   Ct Renal Stone Study  Result Date: 10/11/2018 CLINICAL DATA:  Low back pain.  Fever.  Shortness of breath.  Cough. EXAM: CT ABDOMEN AND PELVIS WITHOUT CONTRAST TECHNIQUE: Multidetector CT imaging of the abdomen and pelvis was performed following the standard protocol without IV contrast. COMPARISON:  Plain film 03/23/2015.  No comparison CT. FINDINGS: Lower chest: Bibasilar pulmonary nodules including at 5 mm on 05/05. Normal heart size without pericardial or pleural effusion. Hepatobiliary: Mild hepatic steatosis. Right hepatic lobe 1.7 cm cyst. Normal gallbladder, without biliary ductal dilatation. Pancreas: Normal, without mass or ductal dilatation. Spleen: Normal in size, without focal abnormality. Adrenals/Urinary Tract: Normal adrenal glands. No renal calculi or hydronephrosis. No hydroureter or ureteric calculi. No bladder calculi. Stomach/Bowel: Normal stomach, without wall thickening. Colonic stool burden suggests constipation. Normal terminal ileum and appendix. Normal small bowel. Vascular/Lymphatic: Aortic atherosclerosis. No abdominopelvic adenopathy. Reproductive: Uterine calcifications are small and may be dystrophic or secondary to underlying fibroids. No adnexal mass. Other: No significant free fluid. Musculoskeletal: Trace L4-5 anterolisthesis. IMPRESSION: 1.  No urinary  tract calculi or hydronephrosis. 2. Bibasilar pulmonary nodules of up to 5 mm. No follow-up needed if patient is low-risk. Non-contrast chest CT can be considered in 12 months if patient is high-risk. This recommendation follows the consensus statement: Guidelines for Management of Incidental Pulmonary Nodules Detected on CT Images: From the Fleischner Society 2017; Radiology 2017; 284:228-243. 3. Mild hepatic steatosis. 4.  Possible constipation. 5. Normal appendix. Electronically Signed   By: Abigail Miyamoto M.D.   On: 10/11/2018 08:12    Procedures Procedures (including critical care time)  Medications Ordered in ED Medications  sodium chloride flush (NS) 0.9 % injection 3 mL (has no administration in time range)  sodium chloride 0.9 % bolus 1,000 mL (has no administration in time range)  acetaminophen (TYLENOL) tablet 1,000 mg (1,000 mg Oral Given 10/11/18 0819)  oxyCODONE (Oxy IR/ROXICODONE) immediate release tablet 5 mg (5 mg Oral Given 10/11/18 [id number])     Initial Impression / Assessment and Plan / ED Course  I have reviewed the triage vital signs and the nursing notes.  Pertinent labs & imaging results that were available during my care of the patient were reviewed by me and considered in my medical decision making (see chart for details).        58 yo F with a cc of R flank pain and fever.  Patient febrile here.  Location in an area for possible RLL pna vs pyelo vs renal stone with infection.  Will obtain CT stone study.  Give fluids, tylenol, pain meds.   Patient CT scan has returned prior to her getting an IV.  I discussed the results with her.  CT was negative for kidney stone.  No obvious signs of pyelonephritis on CT.  No other intra-abdominal pathology was noted.  LFTs were normal.  At this point the patient is requesting to be discharged home.  As this is occurring in the middle of the pandemic I will send off a test for the novel coronavirus.  Have her self isolate at home.  PCP  follow-up.  Abigale Kapela was evaluated in Emergency Department on 10/11/2018 for the symptoms described in the history of present illness. He/she was evaluated in the context of the global  COVID-19 pandemic, which necessitated consideration that the patient might be at risk for infection with the SARS-CoV-2 virus that causes COVID-19. Institutional protocols and algorithms that pertain to the evaluation of patients at risk for COVID-19 are in a state of rapid change based on information released by regulatory bodies including the CDC and federal and state organizations. These policies and algorithms were followed during the patient's care in the ED.  8:21 AM:  I have discussed the diagnosis/risks/treatment options with the patient and believe the pt to be eligible for discharge home to follow-up with PCP. We also discussed returning to the ED immediately if new or worsening sx occur. We discussed the sx which are most concerning (e.g., sudden worsening pain, fever, inability to tolerate by mouth) that necessitate immediate return. Medications administered to the patient during their visit and any new prescriptions provided to the patient are listed below.  Medications given during this visit Medications  sodium chloride flush (NS) 0.9 % injection 3 mL (has no administration in time range)  sodium chloride 0.9 % bolus 1,000 mL (has no administration in time range)  acetaminophen (TYLENOL) tablet 1,000 mg (1,000 mg Oral Given 10/11/18 0819)  oxyCODONE (Oxy IR/ROXICODONE) immediate release tablet 5 mg (5 mg Oral Given 10/11/18 0819)     The patient appears reasonably screen and/or stabilized for discharge and I doubt any other medical condition or other [facility] requiring further screening, evaluation, or treatment in the ED at this time prior to discharge.    Final Clinical Impressions(s) / ED Diagnoses   Final diagnoses:  Flank pain  Fever, unspecified fever cause  Benign essential microscopic  hematuria    ED Discharge Orders         Ordered    cephALEXin (KEFLEX) 500 MG capsule  4 times daily     10/11/18 [place], [place], DO 10/11/18 [phone]

## 2018-10-11 NOTE — ED Notes (Signed)
Patient verbalizes understanding of discharge instructions. Opportunity for questioning and answers were provided. Armband removed by staff, pt discharged from ED home via POV.  

## 2018-10-11 NOTE — ED Triage Notes (Signed)
Patient with lower back pain, states that she did not injury her back.  She does have a low grade fever, some shortness of breath with a cough.  She denies any exposure to Covid.

## 2018-10-11 NOTE — Discharge Instructions (Signed)
Take tylenol 2 pills 4 times a day and motrin 4 pills 3 times a day.  Drink plenty of fluids.  Return for worsening shortness of breath, headache, confusion. Follow up with your family doctor.   

## 2018-10-13 LAB — NOVEL CORONAVIRUS, NAA (HOSP ORDER, SEND-OUT TO REF LAB; TAT 18-24 HRS)

## 2018-10-15 ENCOUNTER — Telehealth: Payer: Self-pay | Admitting: General Practice

## 2018-10-15 NOTE — Telephone Encounter (Signed)
The fax # provided [phone] failed fax did not go thru. Results have been mailed

## 2018-10-15 NOTE — Telephone Encounter (Signed)
Patient called and requesting results be faxed to [phone]. Patient is also requesting results mailed to address as well. Please advise.

## 2018-10-16 ENCOUNTER — Encounter: Payer: Self-pay | Admitting: Family Medicine

## 2018-10-16 ENCOUNTER — Other Ambulatory Visit: Payer: Self-pay

## 2018-10-16 ENCOUNTER — Ambulatory Visit (INDEPENDENT_AMBULATORY_CARE_PROVIDER_SITE_OTHER): Payer: Managed Care, Other (non HMO) | Admitting: Family Medicine

## 2018-10-16 VITALS — BP 137/74 | HR 96 | Temp 98.7°F | Ht 64.0 in | Wt 205.0 lb

## 2018-10-16 DIAGNOSIS — E039 Hypothyroidism, unspecified: Secondary | ICD-10-CM

## 2018-10-16 DIAGNOSIS — N10 Acute tubulo-interstitial nephritis: Secondary | ICD-10-CM

## 2018-10-16 DIAGNOSIS — R109 Unspecified abdominal pain: Secondary | ICD-10-CM | POA: Diagnosis not present

## 2018-10-16 LAB — POC MICROSCOPIC URINALYSIS (UMFC): Mucus: ABSENT

## 2018-10-16 LAB — POCT URINALYSIS DIP (MANUAL ENTRY)
Bilirubin, UA: NEGATIVE
Glucose, UA: NEGATIVE mg/dL
Ketones, POC UA: NEGATIVE mg/dL
Leukocytes, UA: NEGATIVE
Nitrite, UA: NEGATIVE
Protein Ur, POC: NEGATIVE mg/dL
Spec Grav, UA: 1.02 (ref 1.010–1.025)
Urobilinogen, UA: 0.2 E.U./dL
pH, UA: 5 (ref 5.0–8.0)

## 2018-10-16 LAB — POCT CBC
Granulocyte percent: 65 %G (ref 37–80)
HCT, POC: 39.9 % (ref 29–41)
Hemoglobin: 13.1 g/dL (ref 11–14.6)
Lymph, poc: 1.7 (ref 0.6–3.4)
MCH, POC: 28.9 pg (ref 27–31.2)
MCHC: 32.8 g/dL (ref 31.8–35.4)
MCV: 88.2 fL (ref 76–111)
MID (cbc): 0.4 (ref 0–0.9)
MPV: 6.6 fL (ref 0–99.8)
POC Granulocyte: 4 (ref 2–6.9)
POC LYMPH PERCENT: 27.9 %L (ref 10–50)
POC MID %: 7.1 %M (ref 0–12)
Platelet Count, POC: 292 10*3/uL (ref 142–424)
RBC: 4.52 M/uL (ref 4.04–5.48)
RDW, POC: 14.1 %
WBC: 6.2 10*3/uL (ref 4.6–10.2)

## 2018-10-16 NOTE — Progress Notes (Signed)
Patient ID: Sheri West, female    DOB: 1960/04/15  Age: 58 y.o. MRN: [id number]  Chief Complaint  Patient presents with  . Flank Pain  . tsh    f/u   . Hospitalization Follow-up    ed f/u     Subjective:   Patient is here for a follow-up from her emergency room visit.  She got sick last Saturday, 6 days ago, when she was at work.  Sunday she had to go to the emergency room.  She had fever and backache.  She was treated for urinary tract infection with cephalexin.  Her back is still hurting her some but she is doing better.  No dysuria.  She has a history of hypothyroidism but has not taken her thyroid medication for the last 3 months.  She did not get an appointment to come back in because of the COVID epidemic.  No other complaints.  She is Spanish-speaking, but is been in the [place] for many years and has many English-speaking coworkers and she does very [employer] with her broken [place].  She decided we did not need the computer interpreter.  Current allergies, medications, problem list, past/family and social histories reviewed.  Objective:  BP 137/74 (BP Location: Right Arm, Patient Position: Sitting, Cuff Size: Normal)   Pulse 96   Temp 98.7 F (37.1 C) (Oral)   Ht 5\' 4"  (1.626 m)   Wt 205 lb (93 kg)   SpO2 96%   BMI 35.19 kg/m   Pleasant lady alert and oriented no acute distress.  Mild bilateral CVA tenderness.  Abdomen soft without masses.  Mild suprapubic and left lower quadrant tenderness.  CBC showed a white count of 6200 which is down from 16,000 5 days ago in the hospital.  Urinalysis looks okay today. Assessment & Plan:   Assessment: 1. Acute pyelonephritis   2. Flank pain   3. Hypothyroidism, unspecified type       Plan: Continue her antibiotics.  See the instructions.  Can return to work on Tuesday after Labor Day.  Filled out FMLA paperwork for her.  Instructions.  Orders Placed This Encounter  Procedures  . TSH  . POCT CBC  . POCT urinalysis  dipstick  . POCT Microscopic Urinalysis ([facility])    No orders of the defined types were placed in this encounter.        Patient Instructions    Drink plenty of fluids to keep flushing your kidneys out well.  Continue taking your antibiotic as directed  Return or go to the emergency room if worse at any time.  However it appears that your infection is doing much better and should continue to resolve.  The back pain should resolve as the infection clears on up the rest of the way.  You should be okay to return to work on Tuesday.  If you find that you are not, you would have to come back and get really checked.  We are checking the labs on your thyroid.  We will decide whether you need to go back on the medication when those reports come back.  If you do not hear from [place] over the next 2 weeks on that please make sure you check on the results.  You need to talk to the desk about arranging a regular physician here to follow-up on you in about 4 to 6 months.  If you have lab work done today you will be contacted with your lab results within the next 2 weeks.  If you have not heard from [place] then please contact us. The fastest way to get your results is to register for My Chart.   IF you received an x-ray today, you will receive an invoice from [facility]. Please contact [facility] at [phone] with questions or concerns regarding your invoice.   IF you received labwork today, you will receive an invoice from [facility]. Please contact [facility] at [phone] with questions or concerns regarding your invoice.   Our billing staff will not be able to assist you with questions regarding bills from these companies.  You will be contacted with the lab results as soon as they are available. The fastest way to get your results is to activate your My Chart account. Instructions are located on the last page of this paperwork. If you have not heard from [place] regarding the results  in 2 weeks, please contact this office.         Return in about 6 months (around 04/15/2019), or Needs a primary care doctor for a regular follow-up of her thyroid in 4 to 6 months.   Ruben Reason, MD 10/16/2018

## 2018-10-16 NOTE — Patient Instructions (Addendum)
  Drink plenty of fluids to keep flushing your kidneys out well.  Continue taking your antibiotic as directed  Return or go to the emergency room if worse at any time.  However it appears that your infection is doing much better and should continue to resolve.  The back pain should resolve as the infection clears on up the rest of the way.  You should be okay to return to work on Tuesday.  If you find that you are not, you would have to come back and get really checked.  We are checking the labs on your thyroid.  We will decide whether you need to go back on the medication when those reports come back.  If you do not hear from [place] over the next 2 weeks on that please make sure you check on the results.  Take Tylenol or ibuprofen as needed for back pain.  You need to talk to the desk about arranging a regular physician here to follow-up on you in about 4 to 6 months.  If you have lab work done today you will be contacted with your lab results within the next 2 weeks.  If you have not heard from [place] then please contact us. The fastest way to get your results is to register for My Chart.   IF you received an x-ray today, you will receive an invoice from [facility]. Please contact [facility] at [phone] with questions or concerns regarding your invoice.   IF you received labwork today, you will receive an invoice from [facility]. Please contact [facility] at [phone] with questions or concerns regarding your invoice.   Our billing staff will not be able to assist you with questions regarding bills from these companies.  You will be contacted with the lab results as soon as they are available. The fastest way to get your results is to activate your My Chart account. Instructions are located on the last page of this paperwork. If you have not heard from [place] regarding the results in 2 weeks, please contact this office.

## 2018-10-17 ENCOUNTER — Other Ambulatory Visit: Payer: Self-pay | Admitting: Family Medicine

## 2018-10-17 DIAGNOSIS — E039 Hypothyroidism, unspecified: Secondary | ICD-10-CM

## 2018-10-17 LAB — TSH: TSH: 22.9 u[IU]/mL — ABNORMAL HIGH (ref 0.450–4.500)

## 2018-10-17 MED ORDER — LEVOTHYROXINE SODIUM 112 MCG PO TABS: 112.0000 ug | ORAL_TABLET | Freq: Every day | ORAL | 3 refills | Status: DC

## 2018-11-04 ENCOUNTER — Other Ambulatory Visit: Payer: Self-pay

## 2018-11-04 ENCOUNTER — Ambulatory Visit (INDEPENDENT_AMBULATORY_CARE_PROVIDER_SITE_OTHER): Payer: Managed Care, Other (non HMO) | Admitting: Emergency Medicine

## 2018-11-04 NOTE — Patient Instructions (Signed)
° ° ° °  If you have lab work done today you will be contacted with your lab results within the next 2 weeks.  If you have not heard from us then please contact us. The fastest way to get your results is to register for My Chart. ° ° °IF you received an x-ray today, you will receive an invoice from [facility]. Please contact  Radiology at [phone] with questions or concerns regarding your invoice.  ° °IF you received labwork today, you will receive an invoice from [facility]. Please contact [facility] at [phone] with questions or concerns regarding your invoice.  ° °Our billing staff will not be able to assist you with questions regarding bills from these companies. ° °You will be contacted with the lab results as soon as they are available. The fastest way to get your results is to activate your My Chart account. Instructions are located on the last page of this paperwork. If you have not heard from us regarding the results in 2 weeks, please contact this office. °  ° ° ° °

## 2018-11-05 ENCOUNTER — Ambulatory Visit (INDEPENDENT_AMBULATORY_CARE_PROVIDER_SITE_OTHER): Payer: Managed Care, Other (non HMO) | Admitting: Family Medicine

## 2018-11-05 DIAGNOSIS — Z23 Encounter for immunization: Secondary | ICD-10-CM

## 2018-11-05 NOTE — Progress Notes (Signed)
Not seen by me

## 2018-11-05 NOTE — Progress Notes (Signed)
Patient ID: Sheri West, female    DOB: 30-Jan-1961  Age: 58 y.o. MRN: [id number]  Chief Complaint  Patient presents with  . Form Completion    Subjective:   Current allergie - s, medications, -problem list, past/family and social histories reviewed.  Objective:  BP 117/75 (BP Location: Right Arm, Patient Position: Sitting, Cuff Size: Large)   Pulse 77   Temp 97.6 F (36.4 C) (Oral)   Resp 18   Ht 5\' 4"  (1.626 m)   Wt 211 lb 3.2 oz (95.8 kg)   SpO2 100%   BMI 36.25 kg/m   -  Assessment & Plan:   Assessment: No diagnosis found.    Plan: -  No orders of the defined types were placed in this encounter.   No orders of the defined types were placed in this encounter.        There are no Patient Instructions on file for this visit.   No follow-ups on file.   Ruben Reason, MD 11/05/2018

## 2019-01-19 ENCOUNTER — Encounter: Payer: Self-pay | Admitting: Family Medicine

## 2019-01-19 ENCOUNTER — Other Ambulatory Visit: Payer: Self-pay

## 2019-01-19 ENCOUNTER — Ambulatory Visit (INDEPENDENT_AMBULATORY_CARE_PROVIDER_SITE_OTHER): Payer: Managed Care, Other (non HMO) | Admitting: Family Medicine

## 2019-01-19 ENCOUNTER — Other Ambulatory Visit: Payer: Self-pay | Admitting: Urgent Care

## 2019-01-19 VITALS — BP 121/80 | HR 69 | Temp 98.2°F | Ht 64.0 in | Wt 211.2 lb

## 2019-01-19 DIAGNOSIS — E039 Hypothyroidism, unspecified: Secondary | ICD-10-CM | POA: Diagnosis not present

## 2019-01-19 DIAGNOSIS — I863 Vulval varices: Secondary | ICD-10-CM | POA: Diagnosis not present

## 2019-01-19 DIAGNOSIS — Z1231 Encounter for screening mammogram for malignant neoplasm of breast: Secondary | ICD-10-CM

## 2019-01-19 MED ORDER — LEVOTHYROXINE SODIUM 112 MCG PO TABS: 112.0000 ug | ORAL_TABLET | Freq: Every day | ORAL | 1 refills | Status: DC

## 2019-01-19 NOTE — Progress Notes (Signed)
12/8/20209:23 AM  Sheri West 02/16/1960, 58 y.o., female [id number]  Chief Complaint  Patient presents with  . Hypothyroidism    wants to talk about the med she is taking.   . Mass    has bumps on vagina says they have been there for 4 mos, some discharge    HPI:   Patient is a 58 y.o. female with past medical history significant for hypothyroidism who presents today for followup  Restarted on levothyroxine in sept by Dr Linna Darner Dx with hypothyroidism about 20 years ago In sept she ran out of medications due to not wanting to come in to see doctor due to covid She is currently taking 180mcg a day Takes every day when she wakes up  Has noticed bumps on her vagina for past several months Not painful, no burning, permanent She uses daily pads for stress incontinence, has seen gyn, referred PT  Lab Results  Component Value Date   TSH 22.900 (H) 10/16/2018    Depression screen [facility] 2/9 01/19/2019 11/05/2018 10/16/2018  Decreased Interest 0 0 0  Down, Depressed, Hopeless 0 0 0  PHQ - 2 Score 0 0 0  Altered sleeping - 0 -  Tired, decreased energy - 0 -  Change in appetite - 0 -  Feeling bad or failure about yourself  - 0 -  Trouble concentrating - 0 -  Moving slowly or fidgety/restless - 0 -  Suicidal thoughts - 0 -  PHQ-9 Score - 0 -  Difficult doing work/chores - Not difficult at all -    Fall Risk  01/19/2019 11/05/2018 10/16/2018 11/11/2017 09/13/2016  Falls in the past year? 0 0 0 No No  Number falls in past yr: 0 0 0 - -  Injury with Fall? 0 - 0 - -  Follow up - Falls evaluation completed Falls evaluation completed - -     No Known Allergies  Prior to Admission medications   Medication Sig Start Date End Date Taking? Authorizing Provider  levothyroxine (SYNTHROID) 112 MCG tablet Take 1 tablet (112 mcg total) by mouth daily. Take 1/2 daily for 10 days, then begin 1 daily. 10/17/18  Yes Posey Boyer, MD    Past Medical History:  Diagnosis Date  . Allergy    . GERD (gastroesophageal reflux disease)   . Hyperlipidemia   . Thyroid disease     Past Surgical History:  Procedure Laterality Date  . FOOT SURGERY    . REDUCTION MAMMAPLASTY    . SHOULDER SURGERY      Social History   Tobacco Use  . Smoking status: Former Research scientist (life sciences)  . Smokeless tobacco: Never Used  . Tobacco comment: quit 20 yrs ago   Substance Use Topics  . Alcohol use: No    Alcohol/week: 0.0 standard drinks    Family History  Problem Relation Age of Onset  . Colon cancer Neg Hx   . Colon polyps Neg Hx   . Esophageal cancer Neg Hx   . Rectal cancer Neg Hx   . Stomach cancer Neg Hx     Review of Systems  Constitutional: Positive for malaise/fatigue. Negative for weight loss.  Respiratory: Negative for cough and shortness of breath.   Cardiovascular: Negative for chest pain and palpitations.  Genitourinary: Negative for dysuria and hematuria.  Endo/Heme/Allergies: Negative for polydipsia.     OBJECTIVE:  Today's Vitals   01/19/19 0915  BP: 121/80  Pulse: 69  Temp: 98.2 F (36.8 C)  SpO2: 97%  Weight:  211 lb 3.2 oz (95.8 kg)  Height: 5\' 4"  (1.626 m)   Body mass index is 36.25 kg/m.  Wt Readings from Last 3 Encounters:  01/19/19 211 lb 3.2 oz (95.8 kg)  11/05/18 211 lb 3.2 oz (95.8 kg)  10/16/18 205 lb (93 kg)    Physical Exam Vitals signs and nursing note reviewed. Exam conducted with a chaperone present.  Constitutional:      Appearance: She is well-developed.  HENT:     Head: Normocephalic and atraumatic.  Eyes:     General: No scleral icterus.    Conjunctiva/sclera: Conjunctivae normal.     Pupils: Pupils are equal, round, and reactive to light.  Neck:     Musculoskeletal: Neck supple.  Pulmonary:     Effort: Pulmonary effort is normal.  Genitourinary:   Skin:    General: Skin is warm and dry.  Neurological:     Mental Status: She is alert and oriented to person, place, and time.     No results found for this or any previous  visit (from the past 24 hour(s)).  No results found.   ASSESSMENT and PLAN  1. Hypothyroidism, unspecified type Checking labs today, medications will be adjusted as needed.  - TSH - levothyroxine (SYNTHROID) 112 MCG tablet; Take 1 tablet (112 mcg total) by mouth daily.  2. Vulvar varicose veins Reassured patient  Return in about 6 months (around 07/20/2019).    Rutherford Guys, MD Primary Care at [place], [place] [address] Ph.  [phone] Fax [phone]

## 2019-01-19 NOTE — Patient Instructions (Signed)
° ° ° °  If you have lab work done today you will be contacted with your lab results within the next 2 weeks.  If you have not heard from us then please contact us. The fastest way to get your results is to register for My Chart. ° ° °IF you received an x-ray today, you will receive an invoice from [facility]. Please contact [facility] at [phone] with questions or concerns regarding your invoice.  ° °IF you received labwork today, you will receive an invoice from [facility]. Please contact [facility] at [phone] with questions or concerns regarding your invoice.  ° °Our billing staff will not be able to assist you with questions regarding bills from these companies. ° °You will be contacted with the lab results as soon as they are available. The fastest way to get your results is to activate your My Chart account. Instructions are located on the last page of this paperwork. If you have not heard from us regarding the results in 2 weeks, please contact this office. °  ° ° ° °

## 2019-01-20 LAB — TSH: TSH: 6.16 u[IU]/mL — ABNORMAL HIGH (ref 0.450–4.500)

## 2019-01-25 ENCOUNTER — Other Ambulatory Visit: Payer: Self-pay | Admitting: Family Medicine

## 2019-01-25 DIAGNOSIS — E039 Hypothyroidism, unspecified: Secondary | ICD-10-CM

## 2019-01-25 MED ORDER — LEVOTHYROXINE SODIUM 125 MCG PO TABS: 125.0000 ug | ORAL_TABLET | Freq: Every day | ORAL | 0 refills | Status: DC

## 2019-01-27 ENCOUNTER — Encounter: Payer: Self-pay | Admitting: Radiology

## 2019-01-28 DIAGNOSIS — M25512 Pain in left shoulder: Secondary | ICD-10-CM | POA: Insufficient documentation

## 2019-03-11 ENCOUNTER — Ambulatory Visit: Payer: Managed Care, Other (non HMO)

## 2019-04-08 ENCOUNTER — Other Ambulatory Visit: Payer: Self-pay

## 2019-04-08 ENCOUNTER — Ambulatory Visit
Admission: RE | Admit: 2019-04-08 | Discharge: 2019-04-08 | Disposition: A | Discharge status: Home / Self Care | Payer: Managed Care, Other (non HMO) | Source: Ambulatory Visit | Attending: Urgent Care | Admitting: Urgent Care

## 2019-04-08 DIAGNOSIS — Z1231 Encounter for screening mammogram for malignant neoplasm of breast: Secondary | ICD-10-CM

## 2019-04-16 ENCOUNTER — Other Ambulatory Visit: Payer: Self-pay | Admitting: Family Medicine

## 2019-04-16 DIAGNOSIS — E039 Hypothyroidism, unspecified: Secondary | ICD-10-CM

## 2019-07-20 ENCOUNTER — Ambulatory Visit (INDEPENDENT_AMBULATORY_CARE_PROVIDER_SITE_OTHER): Payer: Managed Care, Other (non HMO) | Admitting: Family Medicine

## 2019-07-20 ENCOUNTER — Encounter: Payer: Self-pay | Admitting: Family Medicine

## 2019-07-20 ENCOUNTER — Other Ambulatory Visit: Payer: Self-pay

## 2019-07-20 VITALS — BP 126/86 | HR 66 | Temp 97.8°F | Ht 64.0 in | Wt 212.0 lb

## 2019-07-20 DIAGNOSIS — R7303 Prediabetes: Secondary | ICD-10-CM | POA: Diagnosis not present

## 2019-07-20 DIAGNOSIS — E039 Hypothyroidism, unspecified: Secondary | ICD-10-CM | POA: Diagnosis not present

## 2019-07-20 DIAGNOSIS — G4700 Insomnia, unspecified: Secondary | ICD-10-CM

## 2019-07-20 DIAGNOSIS — E785 Hyperlipidemia, unspecified: Secondary | ICD-10-CM

## 2019-07-20 DIAGNOSIS — M25512 Pain in left shoulder: Secondary | ICD-10-CM

## 2019-07-20 MED ORDER — TRAZODONE HCL 50 MG PO TABS: 25.0000 mg | ORAL_TABLET | Freq: Every evening | ORAL | 3 refills | Status: AC | PRN

## 2019-07-20 MED ORDER — LEVOTHYROXINE SODIUM 125 MCG PO TABS: ORAL_TABLET | ORAL | 1 refills | Status: DC

## 2019-07-20 NOTE — Progress Notes (Signed)
6/8/20219:25 AM  Sheri West [birth year], 59 y.o., female [id number]  Chief Complaint  Patient presents with  . Hypothyroidism    feels she may want to consider surgery on her thyroid  . Medication Refill    levothyroxine and wants refill on oxycodone for post surgery pain, given by another md.   . Health Maintenance    will make pap nx appt    HPI:   Patient is a 59 y.o. female with past medical history significant for hypothyroidism, HLP, prediabetes who presents today for routine followup  Last OV dec 2021 - increased levothyroxine to 162mcg  Tolerating new dose, denies any missed doses, takes every morning as prescribed Having hair loss, thinning eyebrows, dry skin, normal BMs Feeling fatigued/DOE, polydipsia and polyuria occ dizziness, no LOC, no nausea or vomiting, no abd pain No chest pain, palpitations occ leg swelling, has varicose veins Not sleeping well, having problems falling asleep  Per chart review able to see that she has been seeing, [facility] for pain of left shoulder, last PT visit yesterday, had left shoulder arthroscopy with rotator cuff repair done April 26 2019, Dr Esmond Plants  Lab Results  Component Value Date   TSH 6.160 (H) 01/19/2019    Depression screen [facility] 2/9 07/20/2019 01/19/2019 11/05/2018  Decreased Interest 0 0 0  Down, Depressed, Hopeless 0 0 0  PHQ - 2 Score 0 0 0  Altered sleeping - - 0  Tired, decreased energy - - 0  Change in appetite - - 0  Feeling bad or failure about yourself  - - 0  Trouble concentrating - - 0  Moving slowly or fidgety/restless - - 0  Suicidal thoughts - - 0  PHQ-9 Score - - 0  Difficult doing work/chores - - Not difficult at all    Fall Risk  07/20/2019 01/19/2019 11/05/2018 10/16/2018 11/11/2017  Falls in the past year? 0 0 0 0 No  Number falls in past yr: 0 0 0 0 -  Injury with Fall? 0 0 - 0 -  Follow up - - Falls evaluation completed Falls evaluation completed -     No Known  Allergies  Prior to Admission medications   Medication Sig Start Date End Date Taking? Authorizing Provider  levothyroxine (SYNTHROID) 125 MCG tablet TAKE 1 TABLET(125 MCG) BY MOUTH DAILY 04/16/19  Yes Rutherford Guys, MD    Past Medical History:  Diagnosis Date  . Allergy   . GERD (gastroesophageal reflux disease)   . Hyperlipidemia   . Thyroid disease     Past Surgical History:  Procedure Laterality Date  . FOOT SURGERY    . REDUCTION MAMMAPLASTY    . SHOULDER SURGERY      Social History   Tobacco Use  . Smoking status: Former Research scientist (life sciences)  . Smokeless tobacco: Never Used  . Tobacco comment: quit 20 yrs ago   Substance Use Topics  . Alcohol use: No    Alcohol/week: 0.0 standard drinks    Family History  Problem Relation Age of Onset  . Colon cancer Neg Hx   . Colon polyps Neg Hx   . Esophageal cancer Neg Hx   . Rectal cancer Neg Hx   . Stomach cancer Neg Hx     ROS Per hpi  OBJECTIVE:  Today's Vitals   07/20/19 0913  BP: 126/86  Pulse: 66  Temp: 97.8 F (36.6 C)  SpO2: 97%  Weight: 212 lb (96.2 kg)  Height: 5\' 4"  (1.626 m)  Body mass index is 36.39 kg/m.   Wt Readings from Last 3 Encounters:  07/20/19 212 lb (96.2 kg)  01/19/19 211 lb 3.2 oz (95.8 kg)  11/05/18 211 lb 3.2 oz (95.8 kg)     Physical Exam Vitals and nursing note reviewed.  Constitutional:      Appearance: She is well-developed.  HENT:     Head: Normocephalic and atraumatic.     Mouth/Throat:     Pharynx: No oropharyngeal exudate.  Eyes:     General: No scleral icterus.    Conjunctiva/sclera: Conjunctivae normal.     Pupils: Pupils are equal, round, and reactive to light.  Neck:     Thyroid: No thyroid mass, thyromegaly or thyroid tenderness.  Cardiovascular:     Rate and Rhythm: Normal rate and regular rhythm.     Heart sounds: Normal heart sounds. No murmur. No friction rub. No gallop.   Pulmonary:     Effort: Pulmonary effort is normal.     Breath sounds: Normal breath  sounds. No wheezing or rales.  Musculoskeletal:     Cervical back: Neck supple.  Skin:    General: Skin is warm and dry.  Neurological:     Mental Status: She is alert and oriented to person, place, and time.     No results found for this or any previous visit (from the past 24 hour(s)).  No results found.   ASSESSMENT and PLAN  1. Hypothyroidism, unspecified type Checking labs today, medications will be adjusted as needed. Discussed surgery is not treatment for hypothyroidism - Lipid panel - TSH - levothyroxine (SYNTHROID) 125 MCG tablet; TAKE 1 TABLET(125 MCG) BY MOUTH DAILY  2. Hyperlipidemia, unspecified hyperlipidemia type Labs pending, discussed LFM - Lipid panel - Comprehensive metabolic panel  3. Prediabetes Labs pending. Discussed LFM - Hemoglobin A1c  4. Insomnia, unspecified type Trial of trazodone  5. Pain in joint of left shoulder followup with ortho  Other orders - traZODone (DESYREL) 50 MG tablet; Take 0.5-1 tablets (25-50 mg total) by mouth at bedtime as needed for sleep.  Return in about 3 months (around 10/20/2019).    Rutherford Guys, MD Primary Care at [place], [place] [address] Ph.  [phone] Fax [phone]

## 2019-07-20 NOTE — Patient Instructions (Signed)
° ° ° °  If you have lab work done today you will be contacted with your lab results within the next 2 weeks.  If you have not heard from us then please contact us. The fastest way to get your results is to register for My Chart. ° ° °IF you received an x-ray today, you will receive an invoice from [facility]. Please contact [facility] at [phone] with questions or concerns regarding your invoice.  ° °IF you received labwork today, you will receive an invoice from [facility]. Please contact [facility] at [phone] with questions or concerns regarding your invoice.  ° °Our billing staff will not be able to assist you with questions regarding bills from these companies. ° °You will be contacted with the lab results as soon as they are available. The fastest way to get your results is to activate your My Chart account. Instructions are located on the last page of this paperwork. If you have not heard from us regarding the results in 2 weeks, please contact this office. °  ° ° ° °

## 2019-07-21 LAB — COMPREHENSIVE METABOLIC PANEL
ALT: 29 IU/L (ref 0–32)
AST: 23 IU/L (ref 0–40)
Albumin/Globulin Ratio: 1.7 (ref 1.2–2.2)
Albumin: 4.5 g/dL (ref 3.8–4.9)
Alkaline Phosphatase: 121 IU/L (ref 48–121)
BUN/Creatinine Ratio: 18 (ref 12–28)
BUN: 14 mg/dL (ref 8–27)
Bilirubin Total: 0.3 mg/dL (ref 0.0–1.2)
CO2: 23 mmol/L (ref 20–29)
Calcium: 9.6 mg/dL (ref 8.7–10.3)
Chloride: 103 mmol/L (ref 96–106)
Creatinine, Ser: 0.77 mg/dL (ref 0.57–1.00)
GFR calc Af Amer: 97 mL/min/{1.73_m2} (ref >59)
GFR calc non Af Amer: 84 mL/min/{1.73_m2} (ref >59)
Globulin, Total: 2.7 g/dL (ref 1.5–4.5)
Glucose: 130 mg/dL — ABNORMAL HIGH (ref 65–99)
Potassium: 4.5 mmol/L (ref 3.5–5.2)
Sodium: 140 mmol/L (ref 134–144)
Total Protein: 7.2 g/dL (ref 6.0–8.5)

## 2019-07-21 LAB — HEMOGLOBIN A1C
Est. average glucose Bld gHb Est-mCnc: 146 mg/dL
Hgb A1c MFr Bld: 6.7 % — ABNORMAL HIGH (ref 4.8–5.6)

## 2019-07-21 LAB — LIPID PANEL
Chol/HDL Ratio: 7.1 ratio — ABNORMAL HIGH (ref 0.0–4.4)
Cholesterol, Total: 250 mg/dL — ABNORMAL HIGH (ref 100–199)
HDL: 35 mg/dL — ABNORMAL LOW (ref >39)
LDL Chol Calc (NIH): 176 mg/dL — ABNORMAL HIGH (ref 0–99)
Triglycerides: 204 mg/dL — ABNORMAL HIGH (ref 0–149)
VLDL Cholesterol Cal: 39 mg/dL (ref 5–40)

## 2019-07-21 LAB — TSH: TSH: 27.9 u[IU]/mL — ABNORMAL HIGH (ref 0.450–4.500)

## 2019-07-29 ENCOUNTER — Other Ambulatory Visit: Payer: Self-pay | Admitting: Family Medicine

## 2019-07-29 DIAGNOSIS — E039 Hypothyroidism, unspecified: Secondary | ICD-10-CM

## 2019-07-29 MED ORDER — SYNTHROID 150 MCG PO TABS: 150.0000 ug | ORAL_TABLET | Freq: Every day | ORAL | 1 refills | Status: DC

## 2019-09-06 ENCOUNTER — Telehealth: Payer: Self-pay | Admitting: Family Medicine

## 2019-09-06 NOTE — Telephone Encounter (Signed)
Pt is wanting to move her appt form the 29th to the 28th/ called LVM for her to call back because her provider not available on the 28th

## 2019-09-09 ENCOUNTER — Ambulatory Visit (INDEPENDENT_AMBULATORY_CARE_PROVIDER_SITE_OTHER): Payer: Managed Care, Other (non HMO) | Admitting: Family Medicine

## 2019-09-09 ENCOUNTER — Other Ambulatory Visit: Payer: Self-pay

## 2019-09-09 DIAGNOSIS — E039 Hypothyroidism, unspecified: Secondary | ICD-10-CM

## 2019-09-10 LAB — TSH: TSH: 0.126 u[IU]/mL — ABNORMAL LOW (ref 0.450–4.500)

## 2019-09-13 ENCOUNTER — Ambulatory Visit (INDEPENDENT_AMBULATORY_CARE_PROVIDER_SITE_OTHER): Payer: Managed Care, Other (non HMO) | Admitting: Family Medicine

## 2019-09-13 ENCOUNTER — Other Ambulatory Visit: Payer: Self-pay

## 2019-09-13 ENCOUNTER — Encounter: Payer: Self-pay | Admitting: Family Medicine

## 2019-09-13 VITALS — BP 122/79 | HR 64 | Temp 97.1°F | Ht 64.0 in | Wt 212.0 lb

## 2019-09-13 DIAGNOSIS — E039 Hypothyroidism, unspecified: Secondary | ICD-10-CM

## 2019-09-13 DIAGNOSIS — E782 Mixed hyperlipidemia: Secondary | ICD-10-CM | POA: Insufficient documentation

## 2019-09-13 DIAGNOSIS — R739 Hyperglycemia, unspecified: Secondary | ICD-10-CM

## 2019-09-13 MED ORDER — LEVOTHYROXINE SODIUM 137 MCG PO TABS: 137.0000 ug | ORAL_TABLET | Freq: Every day | ORAL | 1 refills | Status: DC

## 2019-09-13 NOTE — Patient Instructions (Addendum)
Prediabetes Prediabetes La prediabetes es la afeccin de tener un nivel de azcar en la sangre (glucemia ms alto de lo normal, aunque no lo suficientemente alto para recibir un diagnstico de diabetes tipo2. El hecho de ser prediabtico lo pone en riesgo de desarrollar diabetes tipo 2 (diabetes mellitus tipo2). La prediabetes puede denominarse intolerancia a la glucosa o alteracin de la glucosa en ayunas. Generalmente, la prediabetes no causa sntomas. El mdico puede diagnosticar esta afeccin por los anlisis de [place]. Es posible que le realicen un anlisis para determinar si tiene prediabetes si tiene sobrepeso y al menos algn otro factor de riesgo de prediabetes. Qu es la glucemia y cmo se mide? La glucemia es la cantidad de glucosa presente en el torrente sanguneo. La glucosa proviene de los alimentos que ingiere que contienen azcares y almidones (hidratos de carbono), que el cuerpo descompone en glucosa. El nivel de glucemia se puede medir en mg/dl (miligramos por decilitro) o mmol/l (milimoles por litro). La glucemia puede comprobarse con uno o ms de los siguientes anlisis de sangre:  Prueba de la glucemia en ayunas. No se le permitir comer (tendr que hacer ayuno) durante 8horas o ms antes de que se le tome una Mona de [place]. ? El rango normal de glucemia en ayunas es de 70 a 100mg /dl (3,9 a 5,7mmol/l).  Una prueba de sangre de A1c (hemoglobina A1c). Esta prueba proporciona informacin sobre el control de la glucemia durante los ltimos 2 o 50meses.  Prueba de tolerancia a la glucosa oral (PTGO). Esta prueba mide la glucemia en dos momentos: ? Despus de ayunar. Este es el valor inicial. ? Dos horas despus de tomar una bebida que contiene glucosa. Pueden diagnosticarle prediabetes en los siguientes casos:  Si su glucemia en ayunas es de 100 a 125 mg/dl (5,6 a 6,9 mmol/l).  Si su nivel de A1c es de 5,7 a 6,4%.  Si su resultado de la PTGO es de 140 a 199mg /dl  (7,8 a 12mmol/l). Estas pruebas de sangre se pueden repetir para Freight forwarder. Cmo puede afectarme esta enfermedad? El pncreas produce una hormona (insulina) que ayuda a Civil Service fast streamer glucosa del torrente sanguneo al interior de las clulas. Cuando las clulas del cuerpo no responden adecuadamente a la insulina que el cuerpo produce (resistencia a la insulina), se acumula exceso de glucosa en la sangre en lugar de entrar en las clulas. Como [place], se puede producir un nivel alto de glucemia (hiperglucemia), lo cual puede causar muchas complicaciones. La hiperglucemia es un sntoma de prediabetes. Tener la glucemia alta durante mucho tiempo es peligroso. Demasiada glucosa en la sangre puede daar los nervios y los vasos sanguneos. El dao prolongado puede derivar en complicaciones de la diabetes, que pueden incluir:  Enfermedad cardaca.  Accidente cerebrovascular.  Ceguera.  Enfermedad renal.  Depresin.  Circulacin deficiente en los pies y las piernas, lo cual podra ocasionar una extirpacin quirrgica (amputacin) en casos graves. [place]? Algunos de los factores de riesgo de prediabetes son los siguientes:  Best boy un familiar con diabetes tipo2.  Tener exceso de [place] u obesidad.  Ser mayor de 20aos de edad.  Ser descendiente de indgenas norteamericanos, afroamericanos, hispanos o latinos, o asiticos o isleos del Pacfico.  Tener un estilo de vida inactivo (sedentario).  Tener antecedentes de enfermedades cardacas.  En las mujeres, tener antecedentes de diabetes gestacional o sndrome del ovario poliqustico (SOP).  Tener niveles bajos del colesterol bueno (HDL-C) o niveles altos de grasas en la sangre (  triglicridos).  Tener presin arterial alta. Qu puedo hacer para prevenir la diabetes?      Haga actividad fsica. ? Haga actividad fsica de intensidad moderada durante 60minutos o ms 5das por semana o con la  frecuencia que le indique su mdico. Esto podra incluir caminatas dinmicas, ciclismo o gimnasia acutica. ? Pregntele al mdico qu actividades son seguras para usted. Una combinacin de actividades puede ser la mejor opcin, por ejemplo, caminar, practicar natacin, andar en bicicleta y hacer entrenamiento de fuerza.  Baje de [employer] se lo haya indicado el mdico. ? Bajar entre el 5% y el 7% del peso corporal puede revertir la resistencia a la insulina. ? El mdico puede determinar cunto peso tiene que perder y Hotel manager a que adelgace de Geographical information systems officer segura.  Siga un plan de alimentacin saludable. Este incluye consumir protenas magras, hidratos de carbono complejos, frutas y verduras frescas, productos lcteos con bajo contenido de grasa y grasas saludables. ? Siga las indicaciones del mdico respecto de las restricciones de comidas o bebidas. ? Programe una cita con un especialista en alimentacin y nutricin (nutricionista certificado) para que lo ayude a Probation officer plan de alimentacin saludable adecuado para usted.  No fume ni consuma ningn producto que contenga tabaco, lo que incluye cigarrillos, tabaco de Higher education careers adviser y Psychologist, sport and exercise. Si necesita ayuda para dejar de fumar, consulte al [employer].  [employer] recetados y de venta libre como se lo haya indicado el mdico. Posiblemente le receten medicamentos para ayudarlo a reducir el riesgo de tener diabetes tipo2.  Concurra a todas las visitas de seguimiento como se lo haya indicado el mdico. Esto es importante. Resumen  La prediabetes es la afeccin de tener un nivel de azcar en la sangre (glucemia ms alto de lo normal, aunque no lo suficientemente alto para recibir un diagnstico de diabetes tipo2.  El hecho de ser prediabtico lo pone en riesgo de desarrollar diabetes tipo 2 (diabetes mellitus tipo2).  Para ayudar a prevenir la diabetes tipo2, haga cambios en el estilo de vida, como realizar actividad fsica y  comer alimentos saludables. Baje de [employer] se lo haya indicado el mdico. Esta informacin no tiene Marine scientist el consejo del mdico. Asegrese de hacerle al mdico cualquier pregunta que tenga. Document Revised: 04/28/2017 Document Reviewed: 03/21/2015 Elsevier Patient Education  [employer].    If you have lab work done today you will be contacted with your lab results within the next 2 weeks.  If you have not heard from [place] then please contact us. The fastest way to get your results is to register for My Chart.   IF you received an x-ray today, you will receive an invoice from [facility]. Please contact [facility] at [phone] with questions or concerns regarding your invoice.   IF you received labwork today, you will receive an invoice from [facility]. Please contact [facility] at [phone] with questions or concerns regarding your invoice.   Our billing staff will not be able to assist you with questions regarding bills from these companies.  You will be contacted with the lab results as soon as they are available. The fastest way to get your results is to activate your My Chart account. Instructions are located on the last page of this paperwork. If you have not heard from [place] regarding the results in 2 weeks, please contact this office.

## 2019-09-13 NOTE — Progress Notes (Signed)
8/2/20218:59 AM  Sheri West 1960/02/19, 59 y.o., female [id number]  Chief Complaint  Patient presents with  . Hypothyroidism    states brand name costs $80 for 30 pills/ follow up lab results     HPI:   Patient is a 59 y.o. female with past medical history significant for hypothyroidism, HLP, prediabetes who presents today for routine followup  Last OV June 2021 - increased levo to 15mcg from 117mcg, elevated lipids and A1c  She has been taking 17mcg daily, fasting Denies any interruptions Has not had any side effects  Lab Results  Component Value Date   HGBA1C 6.7 (H) 07/20/2019   HGBA1C 6.4 (H) 10/14/2017   HGBA1C 6.2 (H) 09/13/2016   Lab Results  Component Value Date   LDLCALC 176 (H) 07/20/2019   CREATININE 0.77 07/20/2019   Lab Results  Component Value Date   TSH 0.126 (L) 09/09/2019    Depression screen PHQ 2/9 09/13/2019 07/20/2019 01/19/2019  Decreased Interest 0 0 0  Down, Depressed, Hopeless 0 0 0  PHQ - 2 Score 0 0 0  Altered sleeping - - -  Tired, decreased energy - - -  Change in appetite - - -  Feeling bad or failure about yourself  - - -  Trouble concentrating - - -  Moving slowly or fidgety/restless - - -  Suicidal thoughts - - -  PHQ-9 Score - - -  Difficult doing work/chores - - -    Fall Risk  09/13/2019 07/20/2019 01/19/2019 11/05/2018 10/16/2018  Falls in the past year? 0 0 0 0 0  Number falls in past yr: 0 0 0 0 0  Injury with Fall? 0 0 0 - 0  Follow up - - - Falls evaluation completed Falls evaluation completed     No Known Allergies  Prior to Admission medications   Medication Sig Start Date End Date Taking? Authorizing Provider  SYNTHROID 150 MCG tablet Take 1 tablet (150 mcg total) by mouth daily before breakfast. 07/29/19  Yes Rutherford Guys, MD  traZODone (DESYREL) 50 MG tablet Take 0.5-1 tablets (25-50 mg total) by mouth at bedtime as needed for sleep. 07/20/19  Yes Rutherford Guys, MD    Past Medical History:    Diagnosis Date  . Allergy   . GERD (gastroesophageal reflux disease)   . Hyperlipidemia   . Thyroid disease     Past Surgical History:  Procedure Laterality Date  . FOOT SURGERY    . REDUCTION MAMMAPLASTY    . SHOULDER SURGERY      Social History   Tobacco Use  . Smoking status: Former Research scientist (life sciences)  . Smokeless tobacco: Never Used  . Tobacco comment: quit 20 yrs ago   Substance Use Topics  . Alcohol use: No    Alcohol/week: 0.0 standard drinks    Family History  Problem Relation Age of Onset  . Colon cancer Neg Hx   . Colon polyps Neg Hx   . Esophageal cancer Neg Hx   . Rectal cancer Neg Hx   . Stomach cancer Neg Hx     Review of Systems  Constitutional: Negative for chills and fever.  Respiratory: Negative for cough and shortness of breath.   Cardiovascular: Negative for chest pain, palpitations and leg swelling.  Gastrointestinal: Negative for abdominal pain, nausea and vomiting.    Per hpi  OBJECTIVE:  Today's Vitals   09/13/19 0842  BP: 122/79  Pulse: 64  Temp: (!) 97.1 F (36.2 C)  SpO2: 97%  Weight: (!) 212 lb (96.2 kg)  Height: 5\' 4"  (1.626 m)   Body mass index is 36.39 kg/m.   Wt Readings from Last 3 Encounters:  09/13/19 (!) 212 lb (96.2 kg)  07/20/19 212 lb (96.2 kg)  01/19/19 211 lb 3.2 oz (95.8 kg)     Physical Exam Vitals and nursing note reviewed.  Constitutional:      Appearance: She is well-developed.  HENT:     Head: Normocephalic and atraumatic.  Eyes:     General: No scleral icterus.    Conjunctiva/sclera: Conjunctivae normal.     Pupils: Pupils are equal, round, and reactive to light.  Pulmonary:     Effort: Pulmonary effort is normal.  Musculoskeletal:     Cervical back: Neck supple.  Skin:    General: Skin is warm and dry.  Neurological:     Mental Status: She is alert and oriented to person, place, and time.       No results found for this or any previous visit (from the past 24 hour(s)).  No results  found.   ASSESSMENT and PLAN  1. Acquired hypothyroidism Not at goal, decreasing to 133mcg from 147mcg. Changing back to levo due to cost. Reviewed med compliance.  - TSH; Future  2. Mixed hyperlipidemia In setting of uncontrolled thyroid. Reviewed LFM. Recheck at next visit. - Lipid panel; Future - Comprehensive metabolic panel; Future  3. Hyperglycemia In setting of uncontrolled thyroid. Reviewed LFM. Recheck at next visit. - Hemoglobin A1c; Future  Other orders - levothyroxine (SYNTHROID) 137 MCG tablet; Take 1 tablet (137 mcg total) by mouth daily before breakfast.  Return in about 3 months (around 12/14/2019).    Rutherford Guys, MD Primary Care at [place], [place] [address] Ph.  [phone] Fax [phone]

## 2019-10-19 ENCOUNTER — Ambulatory Visit: Payer: Managed Care, Other (non HMO) | Admitting: Family Medicine

## 2019-12-07 ENCOUNTER — Other Ambulatory Visit: Payer: Self-pay

## 2019-12-07 ENCOUNTER — Encounter: Payer: Self-pay | Admitting: Family Medicine

## 2019-12-07 ENCOUNTER — Ambulatory Visit (INDEPENDENT_AMBULATORY_CARE_PROVIDER_SITE_OTHER): Payer: Managed Care, Other (non HMO) | Admitting: Family Medicine

## 2019-12-07 VITALS — BP 128/78 | HR 65 | Temp 97.5°F | Ht 64.0 in | Wt 207.0 lb

## 2019-12-07 DIAGNOSIS — R7303 Prediabetes: Secondary | ICD-10-CM

## 2019-12-07 DIAGNOSIS — R739 Hyperglycemia, unspecified: Secondary | ICD-10-CM

## 2019-12-07 DIAGNOSIS — E119 Type 2 diabetes mellitus without complications: Secondary | ICD-10-CM | POA: Insufficient documentation

## 2019-12-07 DIAGNOSIS — Z23 Encounter for immunization: Secondary | ICD-10-CM | POA: Diagnosis not present

## 2019-12-07 DIAGNOSIS — E039 Hypothyroidism, unspecified: Secondary | ICD-10-CM

## 2019-12-07 DIAGNOSIS — E782 Mixed hyperlipidemia: Secondary | ICD-10-CM | POA: Diagnosis not present

## 2019-12-07 LAB — POCT GLYCOSYLATED HEMOGLOBIN (HGB A1C): Hemoglobin A1C: 6.6 % — AB (ref 4.0–5.6)

## 2019-12-07 MED ORDER — LEVOTHYROXINE SODIUM 137 MCG PO TABS: 137.0000 ug | ORAL_TABLET | Freq: Every day | ORAL | 3 refills | Status: DC

## 2019-12-07 NOTE — Patient Instructions (Addendum)
Will continue to monitor A1c at your next appointment I will follow up with your with your TSH. Schedule your pap at your earliest convenience.       RE: MyChart  Dear Sheri West  MyChart makes it easy for you to view your health information - all in one secure location - from any computer or mobile device at any time. Use the activation code below to enroll in MyChart online at [email or web address]   Once your account is activated, you can:  Marland Kitchen View your test results. . Communicate securely with your physician's office.  . View your medical history, allergies, medications, and immunizations. . Receive care virtually through an e-Visit.   If you are over 18, you may use features of MyChart to manage the health information of your spouse, children or others you care for.  Download child and adult access forms at [email or web address].    As you activate your MyChart account and need any technical assistance, please call the MyChart technical support line at [phone]).  Be sure to also download the MyChart app for your mobile device.   Thank you for choosing [facility] for your family's health care needs!   MyChart Activation Code:  [email or web address] Expires: 01/21/2020  8:34 AM             [facility]  [address] [place], [place] [address]   If you have lab work done today you will be contacted with your lab results within the next 2 weeks.  If you have not heard from [place] then please contact us. The fastest way to get your results is to register for My Chart.   IF you received an x-ray today, you will receive an invoice from [facility]. Please contact [facility] at [phone] with questions or concerns regarding your invoice.   IF you received labwork today, you will receive an invoice from [facility]. Please contact [facility] at [phone] with questions or concerns regarding your invoice.   Our billing  staff will not be able to assist you with questions regarding bills from these companies.  You will be contacted with the lab results as soon as they are available. The fastest way to get your results is to activate your My Chart account. Instructions are located on the last page of this paperwork. If you have not heard from [place] regarding the results in 2 weeks, please contact this office.      Diabetes Mellitus and Nutrition, Adult When you have diabetes (diabetes mellitus), it is very important to have healthy eating habits because your blood sugar (glucose) levels are greatly affected by what you eat and drink. Eating healthy foods in the appropriate amounts, at about the same times every day, can help you:  Control your blood glucose.  Lower your risk of heart disease.  Improve your blood pressure.  Reach or maintain a healthy weight. Every person with diabetes is different, and each person has different needs for a meal plan. Your health care provider may recommend that you work with a diet and nutrition specialist (dietitian) to make a meal plan that is best for you. Your meal plan may vary depending on factors such as:  The calories you need.  The medicines you take.  Your weight.  Your blood glucose, blood pressure, and cholesterol levels.  Your activity level.  Other health conditions you have, such as heart or kidney disease. How do carbohydrates affect me? Carbohydrates, also called carbs, affect your blood glucose  level more than any other type of food. Eating carbs naturally raises the amount of glucose in your blood. Carb counting is a method for keeping track of how many carbs you eat. Counting carbs is important to keep your blood glucose at a healthy level, especially if you use insulin or take certain oral diabetes medicines. It is important to know how many carbs you can safely have in each meal. This is different for every person. Your dietitian can help you  calculate how many carbs you should have at each meal and for each snack. Foods that contain carbs include:  Bread, cereal, rice, pasta, and crackers.  Potatoes and corn.  Peas, beans, and lentils.  Milk and yogurt.  Fruit and juice.  Desserts, such as cakes, cookies, ice cream, and candy. How does alcohol affect me? Alcohol can cause a sudden decrease in blood glucose (hypoglycemia), especially if you use insulin or take certain oral diabetes medicines. Hypoglycemia can be a life-threatening condition. Symptoms of hypoglycemia (sleepiness, dizziness, and confusion) are similar to symptoms of having too much alcohol. If your health care provider says that alcohol is safe for you, follow these guidelines:  Limit alcohol intake to no more than 1 drink per day for nonpregnant women and 2 drinks per day for men. One drink equals 12 oz of beer, 5 oz of wine, or 1 oz of hard liquor.  Do not drink on an empty stomach.  Keep yourself hydrated with water, diet soda, or unsweetened iced tea.  Keep in mind that regular soda, juice, and other mixers may contain a lot of sugar and must be counted as carbs. What are tips for following this plan?  Reading food labels  Start by checking the serving size on the "Nutrition Facts" label of packaged foods and drinks. The amount of calories, carbs, fats, and other nutrients listed on the label is based on one serving of the item. Many items contain more than one serving per package.  Check the total grams (g) of carbs in one serving. You can calculate the number of servings of carbs in one serving by dividing the total carbs by 15. For example, if a food has 30 g of total carbs, it would be equal to 2 servings of carbs.  Check the number of grams (g) of saturated and trans fats in one serving. Choose foods that have low or no amount of these fats.  Check the number of milligrams (mg) of salt (sodium) in one serving. Most people should limit total  sodium intake to less than 2,300 mg per day.  Always check the nutrition information of foods labeled as "low-fat" or "nonfat". These foods may be higher in added sugar or refined carbs and should be avoided.  Talk to your dietitian to identify your daily goals for nutrients listed on the label. Shopping  Avoid buying canned, premade, or processed foods. These foods tend to be high in fat, sodium, and added sugar.  Shop around the outside edge of the grocery store. This includes fresh fruits and vegetables, bulk grains, fresh meats, and fresh dairy. Cooking  Use low-heat cooking methods, such as baking, instead of high-heat cooking methods like deep frying.  Cook using healthy oils, such as olive, canola, or sunflower oil.  Avoid cooking with butter, cream, or high-fat meats. Meal planning  Eat meals and snacks regularly, preferably at the same times every day. Avoid going long periods of time without eating.  Eat foods high in fiber, such  as fresh fruits, vegetables, beans, and whole grains. Talk to your dietitian about how many servings of carbs you can eat at each meal.  Eat 4-6 ounces (oz) of lean protein each day, such as lean meat, chicken, fish, eggs, or tofu. One oz of lean protein is equal to: ? 1 oz of meat, chicken, or fish. ? 1 egg. ?  cup of tofu.  Eat some foods each day that contain healthy fats, such as avocado, nuts, seeds, and fish. Lifestyle  Check your blood glucose regularly.  Exercise regularly as told by your health care provider. This may include: ? 150 minutes of moderate-intensity or vigorous-intensity exercise each week. This could be brisk walking, biking, or water aerobics. ? Stretching and doing strength exercises, such as yoga or weightlifting, at least 2 times a week.  Take medicines as told by your health care provider.  Do not use any products that contain nicotine or tobacco, such as cigarettes and e-cigarettes. If you need help quitting, ask  your health care provider.  Work with a Social worker or diabetes educator to identify strategies to manage stress and any emotional and social challenges. Questions to ask a health care provider  Do I need to meet with a diabetes educator?  Do I need to meet with a dietitian?  What number can I call if I have questions?  When are the best times to check my blood glucose? Where to find more information:  American Diabetes Association: diabetes.org  Academy of Nutrition and Dietetics: www.eatright.[employer] of Diabetes and Digestive and Kidney Diseases (NIH): [employer] Summary  A healthy meal plan will help you control your blood glucose and maintain a healthy lifestyle.  Working with a diet and nutrition specialist (dietitian) can help you make a meal plan that is best for you.  Keep in mind that carbohydrates (carbs) and alcohol have immediate effects on your blood glucose levels. It is important to count carbs and to use alcohol carefully. This information is not intended to replace advice given to you by your health care provider. Make sure you discuss any questions you have with your health care provider. Document Revised: 01/10/2017 Document Reviewed: 03/04/2016 Elsevier Patient Education  2020 [employer].

## 2019-12-07 NOTE — Progress Notes (Signed)
10/26/20218:41 AM  Sheri West 02/08/1961, 59 y.o., female [id number]  Chief Complaint  Patient presents with  . Hypothyroidism  . Diabetes    HPI:   Patient is a 59 y.o. female with past medical history significant for DM, hypothyroid who presents today for medication follow up.  Overall she is doing well No issues at this time Staying busy with work  Diabetes Metformin if elevated next visit Last a1c: 6.7 Lab Results  Component Value Date   HGBA1C 6.6 (A) 12/07/2019   Hypothyroid Lab Results  Component Value Date   TSH 0.126 (L) 09/09/2019  Referral to endocrinology if continue to fluctuate Levothyroxine 175mcg   Depression screen Sheri West 2/9 12/07/2019 09/13/2019 07/20/2019  Decreased Interest 0 0 0  Down, Depressed, Hopeless 0 0 0  PHQ - 2 Score 0 0 0  Altered sleeping - - -  Tired, decreased energy - - -  Change in appetite - - -  Feeling bad or failure about yourself  - - -  Trouble concentrating - - -  Moving slowly or fidgety/restless - - -  Suicidal thoughts - - -  PHQ-9 Score - - -  Difficult doing work/chores - - -    Fall Risk  12/07/2019 09/13/2019 07/20/2019 01/19/2019 11/05/2018  Falls in the past year? 0 0 0 0 0  Number falls in past yr: 0 0 0 0 0  Injury with Fall? 0 0 0 0 -  Follow up Falls evaluation completed - - - Falls evaluation completed     No Known Allergies  Prior to Admission medications   Medication Sig Start Date End Date Taking? Authorizing Provider  levothyroxine (SYNTHROID) 137 MCG tablet Take 1 tablet (137 mcg total) by mouth daily before breakfast. 09/13/19  Yes Sheri Guys, MD  traZODone (DESYREL) 50 MG tablet Take 0.5-1 tablets (25-50 mg total) by mouth at bedtime as needed for sleep. Patient not taking: Reported on 12/07/2019 07/20/19   Sheri Guys, MD    Past Medical History:  Diagnosis Date  . Allergy   . GERD (gastroesophageal reflux disease)   . Hyperlipidemia   . Thyroid disease     Past Surgical  History:  Procedure Laterality Date  . FOOT SURGERY    . REDUCTION MAMMAPLASTY    . SHOULDER SURGERY      Social History   Tobacco Use  . Smoking status: Former Research scientist (life sciences)  . Smokeless tobacco: Never Used  . Tobacco comment: quit 20 yrs ago   Substance Use Topics  . Alcohol use: No    Alcohol/week: 0.0 standard drinks    Family History  Problem Relation Age of Onset  . Colon cancer Neg Hx   . Colon polyps Neg Hx   . Esophageal cancer Neg Hx   . Rectal cancer Neg Hx   . Stomach cancer Neg Hx     Review of Systems  Constitutional: Negative for chills, fever and malaise/fatigue.  Eyes: Negative for blurred vision and double vision.  Respiratory: Negative for cough, shortness of breath and wheezing.   Cardiovascular: Negative for chest pain, palpitations and leg swelling.  Gastrointestinal: Negative for abdominal pain, blood in stool, constipation, diarrhea, heartburn, nausea and vomiting.  Genitourinary: Negative for dysuria, frequency and hematuria.  Musculoskeletal: Negative for back pain and joint pain.  Skin: Negative for rash.  Neurological: Negative for dizziness, weakness and headaches.     OBJECTIVE:  Today's Vitals   12/07/19 0754  BP: 128/78  Pulse: 65  Temp: Marland Kitchen)  97.5 F (36.4 C)  SpO2: 96%  Weight: 207 lb (93.9 kg)  Height: 5\' 4"  (1.626 m)   Body mass index is 35.53 kg/m.   Physical Exam Constitutional:      General: She is not in acute distress.    Appearance: Normal appearance. She is not ill-appearing.  HENT:     Head: Normocephalic.  Cardiovascular:     Rate and Rhythm: Normal rate and regular rhythm.     Pulses: Normal pulses.     Heart sounds: Normal heart sounds. No murmur heard.  No friction rub. No gallop.   Pulmonary:     Effort: Pulmonary effort is normal. No respiratory distress.     Breath sounds: Normal breath sounds. No stridor. No wheezing, rhonchi or rales.  Abdominal:     General: Bowel sounds are normal.     Palpations:  Abdomen is soft.     Tenderness: There is no abdominal tenderness.  Musculoskeletal:     Right lower leg: No edema.     Left lower leg: No edema.  Skin:    General: Skin is warm and dry.  Neurological:     Mental Status: She is alert and oriented to person, place, and time.  Psychiatric:        Mood and Affect: Mood normal.        Behavior: Behavior normal.     Results for orders placed or performed in visit on 12/07/19 (from the past 24 hour(s))  POCT glycosylated hemoglobin (Hb A1C)     Status: Abnormal   Collection Time: 12/07/19  8:31 AM  Result Value Ref Range   Hemoglobin A1C 6.6 (A) 4.0 - 5.6 %   HbA1c POC (<> result, manual entry)     HbA1c, POC (prediabetic range)     HbA1c, POC (controlled diabetic range)      No results found.   ASSESSMENT and PLAN  Problem List Items Addressed This Visit      Endocrine   Hypothyroidism   Relevant Medications   levothyroxine (SYNTHROID) 137 MCG tablet Will follow up with lab results Discussed referral to endocrinology if this continues to be a challenge.     Other   Mixed hyperlipidemia    Other Visit Diagnoses    Prediabetes    -  Primary   Relevant Orders   Microalbumin / creatinine urine ratio   Ambulatory referral to Ophthalmology   POCT glycosylated hemoglobin (Hb A1C) (Completed) Last A1c 6.7, this visit 6.6 Discussed medication options, she would like to wait one more visit Encouraged to continue to work on improving diet and increasing exercise. Discussed the need to start a statin, she will consider and discuss at next visit        Pap: Needs, she will schedule at her earliest convenience.  Return in about 3 months (around 03/08/2020).    Huston Foley Ryka Beighley, FNP-BC Primary [facility] at [facility] [place], [place] [address] Ph.  [phone] Fax [phone]

## 2019-12-08 LAB — COMPREHENSIVE METABOLIC PANEL
ALT: 26 IU/L (ref 0–32)
AST: 20 IU/L (ref 0–40)
Albumin/Globulin Ratio: 1.6 (ref 1.2–2.2)
Albumin: 4.1 g/dL (ref 3.8–4.9)
Alkaline Phosphatase: 111 IU/L (ref 44–121)
BUN/Creatinine Ratio: 23 (ref 12–28)
BUN: 15 mg/dL (ref 8–27)
Bilirubin Total: 0.3 mg/dL (ref 0.0–1.2)
CO2: 22 mmol/L (ref 20–29)
Calcium: 9.2 mg/dL (ref 8.7–10.3)
Chloride: 105 mmol/L (ref 96–106)
Creatinine, Ser: 0.64 mg/dL (ref 0.57–1.00)
GFR calc Af Amer: 112 mL/min/{1.73_m2} (ref >59)
GFR calc non Af Amer: 97 mL/min/{1.73_m2} (ref >59)
Globulin, Total: 2.6 g/dL (ref 1.5–4.5)
Glucose: 116 mg/dL — ABNORMAL HIGH (ref 65–99)
Potassium: 4.1 mmol/L (ref 3.5–5.2)
Sodium: 140 mmol/L (ref 134–144)
Total Protein: 6.7 g/dL (ref 6.0–8.5)

## 2019-12-08 LAB — LIPID PANEL
Chol/HDL Ratio: 4.3 ratio (ref 0.0–4.4)
Cholesterol, Total: 166 mg/dL (ref 100–199)
HDL: 39 mg/dL — ABNORMAL LOW (ref >39)
LDL Chol Calc (NIH): 103 mg/dL — ABNORMAL HIGH (ref 0–99)
Triglycerides: 136 mg/dL (ref 0–149)
VLDL Cholesterol Cal: 24 mg/dL (ref 5–40)

## 2019-12-08 LAB — MICROALBUMIN / CREATININE URINE RATIO
Creatinine, Urine: 75.8 mg/dL
Microalb/Creat Ratio: 24 mg/g creat (ref 0–29)
Microalbumin, Urine: 18 ug/mL

## 2019-12-08 LAB — TSH: TSH: 0.092 u[IU]/mL — ABNORMAL LOW (ref 0.450–4.500)

## 2019-12-08 LAB — HEMOGLOBIN A1C
Est. average glucose Bld gHb Est-mCnc: 146 mg/dL
Hgb A1c MFr Bld: 6.7 % — ABNORMAL HIGH (ref 4.8–5.6)

## 2019-12-29 ENCOUNTER — Telehealth: Payer: Self-pay | Admitting: General Practice

## 2019-12-29 NOTE — Telephone Encounter (Signed)
Pt came in and is wanting a call about her labs. Pt is requesting a person that speaks spanish if possible. Please advise.

## 2019-12-30 ENCOUNTER — Other Ambulatory Visit: Payer: Self-pay | Admitting: Family Medicine

## 2019-12-30 DIAGNOSIS — E119 Type 2 diabetes mellitus without complications: Secondary | ICD-10-CM

## 2019-12-30 DIAGNOSIS — E039 Hypothyroidism, unspecified: Secondary | ICD-10-CM

## 2019-12-30 DIAGNOSIS — E782 Mixed hyperlipidemia: Secondary | ICD-10-CM

## 2019-12-30 MED ORDER — LEVOTHYROXINE SODIUM 125 MCG PO TABS: 125.0000 ug | ORAL_TABLET | Freq: Every day | ORAL | 2 refills | Status: DC

## 2019-12-30 MED ORDER — METFORMIN HCL 500 MG PO TABS: 1000.0000 mg | ORAL_TABLET | Freq: Every day | ORAL | 3 refills | Status: DC

## 2019-12-30 MED ORDER — ROSUVASTATIN CALCIUM 10 MG PO TABS: 10.0000 mg | ORAL_TABLET | Freq: Every day | ORAL | 3 refills | Status: DC

## 2019-12-30 NOTE — Telephone Encounter (Signed)
Yes,  If you could let her know:  A1C remains elevated so I am starting her on Metformin 500mg  once a day in the morning With her Diabetes diagnosis and elevated cholesterol I am starting her on Crestor 10mg  daily  Her TSH remains low. I dropped her levothyroxine to 125 mg per day. We will recheck that lab in 5 weeks.I have placed the order so she can come in as a nurse visit. I have also placed an endocrine referral due to how labile her TSH has been. They will call her.  Thanks!

## 2019-12-30 NOTE — Telephone Encounter (Signed)
Patient was last seen on 12/07/19. It do not look like labs had been review at this time by you. Is there anything you would like for me to relay to the patient about her labs?

## 2019-12-31 NOTE — Telephone Encounter (Signed)
Patient was given below msg and is schedule for lab only visit on 01/26/20. Labs has been placed as future order

## 2020-01-26 ENCOUNTER — Telehealth: Payer: Self-pay | Admitting: Family Medicine

## 2020-01-26 ENCOUNTER — Other Ambulatory Visit: Payer: Self-pay

## 2020-01-26 ENCOUNTER — Ambulatory Visit (INDEPENDENT_AMBULATORY_CARE_PROVIDER_SITE_OTHER): Payer: Managed Care, Other (non HMO) | Admitting: Family Medicine

## 2020-01-26 DIAGNOSIS — E039 Hypothyroidism, unspecified: Secondary | ICD-10-CM

## 2020-01-26 NOTE — Telephone Encounter (Signed)
Patient requests a comparison of the previous labs and the labs drawn today. Patient wants comparison mailed to the home address of [place], [place] [address].  Please advise at [phone].

## 2020-01-26 NOTE — Telephone Encounter (Signed)
Pts husband called back and I relayed messaged they stated understanding. Please advise.

## 2020-01-26 NOTE — Telephone Encounter (Signed)
I have attempted to call pt with no success on reaching them. I have left a message stating that since she did get her labs drawn today we will not see those results until a few days from now. After the results gets back we can send her a copy with her results.

## 2020-01-27 ENCOUNTER — Other Ambulatory Visit: Payer: Self-pay | Admitting: Family Medicine

## 2020-01-27 DIAGNOSIS — E039 Hypothyroidism, unspecified: Secondary | ICD-10-CM

## 2020-01-27 LAB — TSH: TSH: 0.366 u[IU]/mL — ABNORMAL LOW (ref 0.450–4.500)

## 2020-01-27 MED ORDER — LEVOTHYROXINE SODIUM 100 MCG PO TABS: 100.0000 ug | ORAL_TABLET | Freq: Every day | ORAL | 2 refills | Status: DC

## 2020-01-27 NOTE — Telephone Encounter (Signed)
LVM for a return call for details of her lab results and medication information.

## 2020-01-27 NOTE — Progress Notes (Signed)
If you could let Sheri West know her Last TSH was .092 and her TSH this past visit was .366. This is much closer to our goal of .45. I am going to have her decrease her levothyroine one last time to 168mcgs daily. I sent this to her pharmacy. We will recheck this at her next appointment in early February.

## 2020-03-01 ENCOUNTER — Encounter: Payer: Managed Care, Other (non HMO) | Admitting: Family Medicine

## 2020-03-22 ENCOUNTER — Other Ambulatory Visit: Payer: Self-pay

## 2020-03-22 ENCOUNTER — Encounter: Payer: Self-pay | Admitting: Family Medicine

## 2020-03-22 ENCOUNTER — Other Ambulatory Visit (HOSPITAL_COMMUNITY)
Admission: RE | Admit: 2020-03-22 | Discharge: 2020-03-22 | Disposition: A | Discharge status: Home / Self Care | Payer: Managed Care, Other (non HMO) | Source: Ambulatory Visit | Attending: Family Medicine | Admitting: Family Medicine

## 2020-03-22 ENCOUNTER — Ambulatory Visit: Payer: Managed Care, Other (non HMO) | Admitting: Family Medicine

## 2020-03-22 ENCOUNTER — Other Ambulatory Visit: Payer: Self-pay | Admitting: Urgent Care

## 2020-03-22 VITALS — BP 120/74 | HR 60 | Temp 98.7°F | Ht 64.0 in | Wt 204.0 lb

## 2020-03-22 DIAGNOSIS — Z01419 Encounter for gynecological examination (general) (routine) without abnormal findings: Secondary | ICD-10-CM | POA: Insufficient documentation

## 2020-03-22 DIAGNOSIS — E039 Hypothyroidism, unspecified: Secondary | ICD-10-CM | POA: Diagnosis not present

## 2020-03-22 DIAGNOSIS — E119 Type 2 diabetes mellitus without complications: Secondary | ICD-10-CM | POA: Diagnosis not present

## 2020-03-22 DIAGNOSIS — E782 Mixed hyperlipidemia: Secondary | ICD-10-CM

## 2020-03-22 DIAGNOSIS — R32 Unspecified urinary incontinence: Secondary | ICD-10-CM

## 2020-03-22 DIAGNOSIS — Z113 Encounter for screening for infections with a predominantly sexual mode of transmission: Secondary | ICD-10-CM | POA: Insufficient documentation

## 2020-03-22 DIAGNOSIS — Z1231 Encounter for screening mammogram for malignant neoplasm of breast: Secondary | ICD-10-CM

## 2020-03-22 MED ORDER — BLOOD GLUCOSE METER KIT: PACK | 4 refills | Status: AC

## 2020-03-22 NOTE — Patient Instructions (Addendum)
If you have lab work done today you will be contacted with your lab results within the next 2 weeks.  If you have not heard from [place] then please contact us. The fastest way to get your results is to register for My Chart.   IF you received an x-ray today, you will receive an invoice from [facility]. Please contact [facility] at [phone] with questions or concerns regarding your invoice.   IF you received labwork today, you will receive an invoice from [facility]. Please contact [facility] at [phone] with questions or concerns regarding your invoice.   Our billing staff will not be able to assist you with questions regarding bills from these companies.  You will be contacted with the lab results as soon as they are available. The fastest way to get your results is to activate your My Chart account. Instructions are located on the last page of this paperwork. If you have not heard from [place] regarding the results in 2 weeks, please contact this office.      La diabetes mellitus y las normas bsicas de atencin mdica Diabetes Mellitus and Standards of [place] con diabetes (diabetes mellitus) y controlarla puede ser complicado. Su tratamiento de la diabetes puede ser administrado por un equipo de profesionales de la salud, que incluye:  Un mdico especializado en diabetes (endocrinlogo). Tambin podra tener visitas con un enfermero especializado o auxiliar mdico.  Enfermeras.  Un nutricionista certificado.  Un especialista en atencin y educacin sobre la diabetes certificado.  Un especialista en actividad fsica.  Un farmacutico.  [place]ri [place].  Un especialista en pies (podlogo).  Un proveedor de Airline pilot.  Un mdico de cabecera.  Un profesional de salud mental. Cmo controlar la diabetes Puede hacer muchas cosas para controlar con xito la diabetes. Sus mdicos seguirn pautas para ayudarlo a Personal assistant mejor calidad de  atencin. Estas son las pautas generales para su plan de control de la diabetes. Los mdicos tambin podrn darle instrucciones ms especficas. Exmenes fsicos Tras ser diagnosticado con diabetes, y cada ao luego de esto, su mdico le preguntar acerca de sus antecedentes mdicos y familiares. Le harn un examen fsico, que puede incluir:  Medicin de Psychologist, counselling, peso e ndice de masa corporal [facility]).  Control de la presin arterial. Esto se realiza en cada visita mdica de rutina. La presin arterial deseada puede variar en funcin de las enfermedades, la edad y otros factores personales.  Un examen de la tiroides.  Un examen de la piel.  Un examen para deteccin de dao nervioso (neuropata perifrica). Esto puede incluir controlar el pulso de las piernas y los pies, y el nivel de sensibilidad en las manos y pies.  Un examen de pies para inspeccionar la estructura y la piel de los pies, lo que incluye controlar si hay cortes, moretones, enrojecimiento, ampollas, llagas u otros problemas.  Exmenes de deteccin de problemas en los vasos sanguneos (vasculares). Esto puede incluir Corporate treasurer en las piernas y los pies, y Customer service manager. Anlisis de [employer] de tratamiento y [place] necesidades personales, es posible que se le realicen las siguientes pruebas:  Hemoglobina A1C (HbA1C). Este anlisis proporciona informacin sobre el control de la glucemia (glucosa en la sangre) durante los ltimos 2 o 93meses. [place] para ajustar el plan de tratamiento, de ser necesario. Este anlisis se har: ? Al menos 2 veces al ao, si cumple los objetivos del tratamiento. ? CIGNA  al ao, si no cumple los objetivos del tratamiento o si sus objetivos [place].  Anlisis de lpidos, lo que incluye colesterol total, colesterol LDL y HDL, y niveles de triglicridos. ? En relacin al LDL, el objetivo es tener menos de 100mg /dl (5,5 mmol/l). Si tiene alto riesgo de  complicaciones, el objetivo es tener menos de 70 mg/dl (3.9 mmol/l). ? En relacin al HDL, el objetivo es tener 40 mg/dl (2.2 mmol/l) o ms para los hombres y 50 mg/dl (2.8 mmol/l) o ms para las mujeres. Un nivel de colesterol HDL de 60 mg/dl (3.38mmol/l) o superior da una cierta proteccin contra la enfermedad cardaca. ? En relacin a los triglicridos, el objetivo es tener menos de 150 mg/dl (8,3 mmol/l).  Pruebas funcionales hepticas.  Pruebas de la funcin renal.  Pruebas de la funcin tiroidea.   Exmenes dentales y [employer]  Visite al [employer] veces por ao.  Si tiene diabetes tipo1, el mdico puede recomendarle que se haga un examen ocular en un plazo de 5aos despus del diagnstico y, luego, Ardelia Mems vez al ao despus del Tree surgeon. ? Para los nios que tienen diabetes tipo1, el pediatra puede recomendar un examen ocular cuando el nio tiene 11aos o ms y ha tenido diabetes durante 3 a 5 aos. Despus del primer examen, el nio debe hacerse un examen ocular una vez al ao.  Si tiene diabetes tipo2, el mdico puede recomendarle que se haga un examen ocular ni bien haya sido diagnosticado y, luego, cada 1 o 2aos despus del Tree surgeon.   Vacunas  Se recomienda aplicar de forma anual la vacuna contra la gripe (influenza) a todas las personas de 73meses en adelante. Esto es muy importante si tiene diabetes.  La vacuna contra la neumona (antineumoccica) est recomendada para todas las personas de 2aos en adelante que tengan diabetes. Si es mayor de 65aos, puede recibir la vacuna antineumoccica como una serie de dos inyecciones [place].  Se recomienda administrar la vacuna contra la hepatitisB en adultos poco despus de que hayan recibido el diagnstico de diabetes. Los adultos y los nios que tienen diabetes deben recibir todas las vacunas de acuerdo con las recomendaciones especficas segn la edad de los Centros para el Control y la Prevencin de  Probation officer for Disease Control and Prevention, CDC). Salud mental y emocional Se recomienda Optometrist controles para Hydrographic surveyor sntomas de trastornos de Youth worker, ansiedad y depresin en el momento del diagnstico, y posteriormente segn sea necesario. Si los controles revelan la presencia de sntomas, es posible que deba someterse a ms evaluaciones. Es posible que trabaje con un profesional de la salud mental. Siga estas instrucciones en su casa: Plan de tratamiento Usted medir sus niveles de glucemia y quizs pueda administrarse insulina. Su plan de tratamiento ser revisado en cada visita mdica. Usted y Nature conservation officer lo siguiente:  Cmo est recibiendo los medicamentos, incluso la [place].  Cualquier efecto secundario que tenga.  Sus objetivos deseados con relacin al nivel de glucemia.  Con qu frecuencia se mide el nivel de glucemia.  Hbitos del estilo de vida, como nivel de [place] y el consumo de tabaco, alcohol y drogas. Educacin El mdico evaluar qu tan bien se est midiendo los niveles de glucemia y si est recibiendo la insulina y los medicamentos de [place]. El mdico puede derivarlo a:  Teaching laboratory technician en atencin y educacin sobre la diabetes certificado, para Air cabin crew la diabetes durante toda la vida, comenzando desde el diagnstico.  Un nutricionista matriculado que  puede crear y revisar un plan de nutricin personal.  Un especialista en ejercicios que puede analizar el nivel de actividad y un plan de ejercicios. Instrucciones generales  Use los medicamentos de venta libre y los recetados solamente como se lo haya indicado el mdico.  [place] con todas las visitas de seguimiento. Esto es importante. Dnde buscar apoyo Hay muchas redes de 44 para la diabetes, entre ellas:  American Diabetes Association (ADA) (Asociacin Estadounidense de la Diabetes): diabetes.org  Defeat Diabetes Foundation (Fundacin Defeat Diabetes):  defeatdiabetes.org Dnde buscar ms informacin  American Diabetes Association (ADA) (Asociacin Estadounidense de la Diabetes): www.diabetes.org  Association of Diabetes Care & Education Specialists (ADCES) (Asociacin de Especialistas en Atencin y Educacin sobre la Diabetes): diabeteseducator.org  International Diabetes Federation (IDF) (Federacin Internacional de Diabetes): https://www.munoz-bell.org/ Resumen  Tratar la diabetes (diabetes mellitus) puede ser complicado. Su tratamiento de la diabetes puede ser administrado por un equipo de profesionales de Technical sales engineer.  Los mdicos siguen pautas para ayudarlo a Personal assistant mejor calidad de atencin.  Usted debe realizarse exmenes fsicos, anlisis de [place] y controles de la presin arterial, aplicarse vacunas y someterse a estudios de control con regularidad. Mantngase al da sobre cmo controlar la diabetes.  Sus mdicos tambin podrn darle instrucciones ms especficas basndose en su salud. Esta informacin no tiene Marine scientist el consejo del mdico. Asegrese de hacerle al mdico cualquier pregunta que tenga. Document Revised: 09/09/2019 Document Reviewed: 09/09/2019 Elsevier Patient Education  [place] de Papanicolaou Pap Test Por qu me debo realizar esta prueba? La prueba de Papanicolaou, tambin denominada citologa vaginal, es una prueba de cribado para Hydrographic surveyor signos de:  Cncer de la vagina, del cuello uterino y del tero. El cuello uterino es la parte baja del tero que se abre hacia la vagina.  Infeccin.  Cambios que podran ser un signo de que se est desarrollando un cncer (cambios precancerosos). Las mujeres deben realizarse esta prueba con regularidad. En general, debe hacerse una prueba de Papanicolaou cada 3 aos hasta alcanzar la menopausia o hasta los 65 aos. Las [employer] 30 y 15 aos de edad pueden elegir realizarse la prueba de Papanicolaou al mismo tiempo que la prueba del VPH (virus del  papiloma humano) cada 5 aos (en lugar de cada 3 aos). El mdico puede recomendarle que se realice pruebas de Papanicolaou con ms o menos frecuencia en funcin de sus afecciones mdicas y los resultados de la prueba de Papanicolaou anterior. Qu tipo de [place] se toma? El mdico recolectar una muestra de clulas de la superficie del cuello uterino. Lo har utilizando un pequeo hisopo de algodn, una esptula de plstico o un cepillo. Esta muestra se recolecta durante un examen plvico, mientras usted est recostada boca arriba sobre la mesa de examen con los pies en los descansos para pies (estribos). En algunos casos, tambin pueden recolectarse fluidos (secreciones) del cuello uterino y la vagina.   Cmo debo prepararme para este anlisis?  Tenga en cuenta en qu etapa del ciclo menstrual se encuentra. Es posible que se le pida que vuelva a Risk manager la prueba si est Forensic psychologist en que debe Radiation protection practitioner.  Si el da en que debe realizarse la prueba tiene una infeccin vaginal aparente, deber volver a Editor, commissioning prueba.  Siga las instrucciones del mdico acerca de lo siguiente: ? Quarry manager o suspender los medicamentos que [place] habitualmente. Algunos medicamentos pueden [employer] de la prueba, como los digitlicos y Lexicographer. ? [place]  vaginales o los baos de inmersin el da de la prueba o Games developer anterior. Informe al mdico acerca de lo siguiente:  Cualquier alergia que tenga.  Todos los [employer] [place], incluidos vitaminas, hierbas, gotas oftlmicas, cremas y medicamentos de venta libre.  Cualquier trastorno de la sangre que tenga.  Cirugas a las que se haya sometido.  Cualquier afeccin mdica que tenga.  Si est embarazada o podra estarlo. Cmo se informan los resultados? Los [employer] de la prueba se informarn como anormales o normales. Puede producirse un resultado positivo falso. Este tipo de resultado es incorrecto  porque indica que una afeccin est presente cuando en realidad no lo est. Puede producirse un resultado negativo falso. Este tipo de resultado es incorrecto porque indica que una afeccin no est presente cuando en realidad lo est. Qu significan los [place]? Un resultado normal en la prueba significa que no tiene signos de cncer de la vagina, del cuello uterino o del tero. Un resultado anormal puede significar que tiene:  Cncer. Una prueba de Papanicolaou por s sola no es suficiente para Community education officer. En este caso, se le realizarn ms pruebas.  Cambios precancerosos en la vagina, cuello uterino o tero.  Inflamacin del cuello uterino.  Enfermedades de transmisin sexual (ETS).  Infecciones por hongos.  Infecciones por parsitos. Hable con su mdico sobre lo que significan sus [place]. Preguntas para hacerle al mdico Consulte a su mdico o pregunte en el departamento donde se realiza la prueba acerca de lo siguiente:  Cundo estarn disponibles mis resultados?  Cmo obtendr mis resultados?  Cules son las opciones de tratamiento?  Qu otras pruebas necesito?  Cules son los prximos pasos que debo seguir? Resumen  En general, las mujeres deben hacerse una prueba de Papanicolaou cada 3 aos Teacher, English as a foreign language la menopausia o [facility] 75 aos de [place].  El mdico recolectar una muestra de clulas de la superficie del cuello uterino. Lo har utilizando un pequeo hisopo de algodn, una esptula de plstico o un cepillo.  En algunos casos, tambin pueden recolectarse fluidos (secreciones) del cuello uterino y la vagina. Esta informacin no tiene Marine scientist el consejo del mdico. Asegrese de hacerle al mdico cualquier pregunta que tenga. Document Revised: 11/23/2019 Document Reviewed: 11/23/2019 Elsevier Patient Education  2021 [employer].

## 2020-03-22 NOTE — Progress Notes (Signed)
2/9/20228:49 AM  Sheri West August 31, 1960, 60 y.o., female [id number]  Chief Complaint  Patient presents with  . Hypothyroidism    Recheck labs/ has upcoming appt w/ endocrinology  . Diabetes    labs    HPI:   Patient is a 60 y.o. female with past medical history significant for DM, hypothyroid who presents today for toc.  Overall she is doing well Denies acute issues at this time  Diabetes Started Metformin 11/18 Started Crestor 11/18 No issues with either medications Has not been to the eye doctor Lab Results  Component Value Date   HGBA1C 6.6 (A) 12/07/2019   HGBA1C 6.7 (H) 12/07/2019   HGBA1C 6.7 (H) 07/20/2019   Lab Results  Component Value Date   LDLCALC 103 (H) 12/07/2019   CREATININE 0.64 12/07/2019   Hypothyroid  Lab Results  Component Value Date   TSH 0.366 (L) 01/26/2020  Will be seeing Endocrinology next week Levothyroxine 136mcg 10/26 Levothyroxine to 125 11/18 and recheck in 5 weeks. Levothyroxine to 100 12/16 recheck today  Health Maintenance  Topic Date Due  . OPHTHALMOLOGY EXAM  Never done  . PAP SMEAR-Modifier  11/02/2018  . COVID-19 Vaccine (3 - Booster for Pfizer series) 11/13/2019  . TETANUS/TDAP  07/19/2020 (Originally 05/01/1978)  . Hepatitis C Screening  07/19/2020 (Originally April 15, 1960)  . HIV Screening  09/12/2020 (Originally 05/01/1974)  . HEMOGLOBIN A1C  06/06/2020  . FOOT EXAM  09/12/2020  . URINE MICROALBUMIN  12/06/2020  . MAMMOGRAM  04/07/2021  . COLONOSCOPY (Pts 45-48yrs Insurance coverage will need to be confirmed)  08/18/2028  . INFLUENZA VACCINE  Completed  . PNEUMOCOCCAL POLYSACCHARIDE VACCINE AGE 64-64 HIGH RISK  Completed     Depression screen [facility] 2/9 03/22/2020 12/07/2019 09/13/2019  Decreased Interest 0 0 0  Down, Depressed, Hopeless 0 0 0  PHQ - 2 Score 0 0 0  Altered sleeping - - -  Tired, decreased energy - - -  Change in appetite - - -  Feeling bad or failure about yourself  - - -  Trouble  concentrating - - -  Moving slowly or fidgety/restless - - -  Suicidal thoughts - - -  PHQ-9 Score - - -  Difficult doing work/chores - - -    Fall Risk  03/22/2020 12/07/2019 09/13/2019 07/20/2019 01/19/2019  Falls in the past year? 0 0 0 0 0  Number falls in past yr: 0 0 0 0 0  Injury with Fall? 0 0 0 0 0  Follow up Falls evaluation completed Falls evaluation completed - - -     No Known Allergies  Prior to Admission medications   Medication Sig Start Date End Date Taking? Authorizing Provider  levothyroxine (SYNTHROID) 137 MCG tablet Take 1 tablet (137 mcg total) by mouth daily before breakfast. 09/13/19  Yes Rutherford Guys, MD  traZODone (DESYREL) 50 MG tablet Take 0.5-1 tablets (25-50 mg total) by mouth at bedtime as needed for sleep. Patient not taking: Reported on 12/07/2019 07/20/19   Rutherford Guys, MD    Past Medical History:  Diagnosis Date  . Allergy   . GERD (gastroesophageal reflux disease)   . Hyperlipidemia   . Thyroid disease     Past Surgical History:  Procedure Laterality Date  . FOOT SURGERY    . REDUCTION MAMMAPLASTY    . SHOULDER SURGERY      Social History   Tobacco Use  . Smoking status: Former Research scientist (life sciences)  . Smokeless tobacco: Never Used  . Tobacco  comment: quit 20 yrs ago   Substance Use Topics  . Alcohol use: No    Alcohol/week: 0.0 standard drinks    Family History  Problem Relation Age of Onset  . Colon cancer Neg Hx   . Colon polyps Neg Hx   . Esophageal cancer Neg Hx   . Rectal cancer Neg Hx   . Stomach cancer Neg Hx     Review of Systems  Constitutional: Negative for chills, fever and malaise/fatigue.  Eyes: Negative for blurred vision and double vision.  Respiratory: Negative for cough, shortness of breath and wheezing.   Cardiovascular: Negative for chest pain, palpitations and leg swelling.  Gastrointestinal: Negative for abdominal pain, blood in stool, constipation, diarrhea, heartburn, nausea and vomiting.  Genitourinary:  Positive for frequency and urgency. Negative for dysuria and hematuria.       Incontinence  Musculoskeletal: Negative for back pain and joint pain.  Skin: Negative for rash.  Neurological: Negative for dizziness, weakness and headaches.    OBJECTIVE:  Today's Vitals   03/22/20 0810  BP: 120/74  Pulse: 60  Temp: 98.7 F (37.1 C)  SpO2: 97%  Weight: 204 lb (92.5 kg)  Height: $Remove'5\' 4"'AbiHVUq$  (1.626 m)   Body mass index is 35.02 kg/m.   Physical Exam Constitutional:      General: She is not in acute distress.    Appearance: Normal appearance. She is not ill-appearing.  HENT:     Head: Normocephalic.  Cardiovascular:     Rate and Rhythm: Normal rate and regular rhythm.     Pulses: Normal pulses.     Heart sounds: Normal heart sounds. No murmur heard. No friction rub. No gallop.   Pulmonary:     Effort: Pulmonary effort is normal. No respiratory distress.     Breath sounds: Normal breath sounds. No stridor. No wheezing, rhonchi or rales.  Abdominal:     General: Bowel sounds are normal. There is no distension.     Palpations: Abdomen is soft. There is no mass.     Tenderness: There is no abdominal tenderness. There is no right CVA tenderness, left CVA tenderness or guarding.     Hernia: No hernia is present.  Musculoskeletal:     Right lower leg: No edema.     Left lower leg: No edema.  Skin:    General: Skin is warm and dry.  Neurological:     Mental Status: She is alert and oriented to person, place, and time.  Psychiatric:        Mood and Affect: Mood normal.        Behavior: Behavior normal.    Pelvic exam: VULVA: normal appearing vulva with no masses, tenderness or lesions, VAGINA: normal appearing vagina with normal color and discharge, no lesions, PELVIC FLOOR EXAM: no cystocele, rectocele or prolapse noted, cystocele , CERVIX: normal appearing cervix without discharge or lesions, UTERUS: uterus is normal size, shape, consistency and nontender, ADNEXA: normal adnexa in  size, nontender and no masses, PAP: Pap smear done today, thin-prep method, DNA probe for chlamydia and GC obtained, HPV test, exam chaperoned by [facility], LPN.  No results found for this or any previous visit (from the past 24 hour(s)).  No results found.   ASSESSMENT and PLAN     Problem List Items Addressed This Visit      Endocrine   Hypothyroidism - Primary   Relevant Orders   TSH   Type 2 diabetes mellitus without complication, without long-term current use of insulin (  [place])   Relevant Medications   blood glucose meter kit and supplies   Other Relevant Orders   Hemoglobin A1c   Ambulatory referral to Ophthalmology     Other   Mixed hyperlipidemia   Relevant Orders   Lipid Panel    Other Visit Diagnoses    Pap smear, as part of routine gynecological examination       Relevant Orders   Cytology - PAP([place])   Urinary incontinence, unspecified type       Relevant Orders   Ambulatory referral to Urogynecology     Plan  Will follow up with pap results  Will f/u with levothyroxine (TSH), Metformin (A1c) and Crestor (lipid)  Needs to schedule diabetic eye exam  Urogyn referral placed for prolapse and incontinence  Return in about 3 months (around 06/19/2020).    Sheri Foley Lynita Groseclose, FNP-BC [facility] [address] [place], [place] [address] Ph.  [phone] Fax [phone]

## 2020-03-23 LAB — LIPID PANEL
Chol/HDL Ratio: 2.7 ratio (ref 0.0–4.4)
Cholesterol, Total: 122 mg/dL (ref 100–199)
HDL: 46 mg/dL (ref >39)
LDL Chol Calc (NIH): 60 mg/dL (ref 0–99)
Triglycerides: 80 mg/dL (ref 0–149)
VLDL Cholesterol Cal: 16 mg/dL (ref 5–40)

## 2020-03-23 LAB — TSH: TSH: 1.42 u[IU]/mL (ref 0.450–4.500)

## 2020-03-23 LAB — HEMOGLOBIN A1C
Est. average glucose Bld gHb Est-mCnc: 140 mg/dL
Hgb A1c MFr Bld: 6.5 % — ABNORMAL HIGH (ref 4.8–5.6)

## 2020-03-23 NOTE — Progress Notes (Signed)
Labs look great! No changes needed to any medications.

## 2020-03-27 ENCOUNTER — Ambulatory Visit (INDEPENDENT_AMBULATORY_CARE_PROVIDER_SITE_OTHER): Payer: Managed Care, Other (non HMO) | Admitting: Internal Medicine

## 2020-03-27 ENCOUNTER — Encounter: Payer: Self-pay | Admitting: Internal Medicine

## 2020-03-27 ENCOUNTER — Other Ambulatory Visit: Payer: Self-pay

## 2020-03-27 VITALS — BP 144/94 | HR 68 | Ht 64.0 in | Wt 204.0 lb

## 2020-03-27 DIAGNOSIS — E119 Type 2 diabetes mellitus without complications: Secondary | ICD-10-CM

## 2020-03-27 DIAGNOSIS — E039 Hypothyroidism, unspecified: Secondary | ICD-10-CM | POA: Diagnosis not present

## 2020-03-27 NOTE — Progress Notes (Signed)
Name: Sheri West  MRN/ DOB: [id number], 08/05/1960    Age/ Sex: 60 y.o., female    PCP: Just, Laurita Quint, FNP   Reason for Endocrinology Evaluation: Hypothyroidism     Date of Initial Endocrinology Evaluation: 03/27/2020     HPI: [name] is a 60 y.o. female with a past medical history of T2DM, Hypothyroidism and dyslipiemia. The patient presented for initial endocrinology clinic visit on 03/27/2020 for consultative assistance with her Hypothyroidism    She is accompanied by her husband today   She has been diagnosed with hypothyroidism > 20 yrs ago.   She has local neck symptoms to where she feels swelling. Has occasional dysphagia  Denies prior neck surgery or radiation    Denies palpitation Denies diarrhea nor anxiety  Has bee noted with weight loss.  Has hair loss.   Unknown FH of thyroid disease    DIABETES HISTORY: She has been diagnosed with DM in 2021 and started on Metformin. A1c acceptable. Tolerating metformin without side effects .     HISTORY:  Past Medical History:  Past Medical History:  Diagnosis Date  . Allergy   . GERD (gastroesophageal reflux disease)   . Hyperlipidemia   . Thyroid disease    Past Surgical History:  Past Surgical History:  Procedure Laterality Date  . FOOT SURGERY    . REDUCTION MAMMAPLASTY    . SHOULDER SURGERY        Social History:  reports that she has quit smoking. She has never used smokeless tobacco. She reports that she does not drink alcohol and does not use drugs.  Family History: family history is not on file.   HOME MEDICATIONS: Allergies as of 03/27/2020   No Known Allergies     Medication List       Accurate as of March 27, 2020 10:30 AM. If you have any questions, ask your nurse or doctor.        blood glucose meter kit and supplies Dispense based on patient and insurance preference. Use up to four times daily as directed. (FOR ICD-10 E10.9, E11.9).   levothyroxine 125  MCG tablet Commonly known as: SYNTHROID Take 125 mcg by mouth daily. What changed: Another medication with the same name was removed. Continue taking this medication, and follow the directions you see here. Changed by: Dorita Sciara, MD   metFORMIN 500 MG tablet Commonly known as: GLUCOPHAGE Take 2 tablets (1,000 mg total) by mouth daily with breakfast.   rosuvastatin 10 MG tablet Commonly known as: Crestor Take 1 tablet (10 mg total) by mouth daily.   traZODone 50 MG tablet Commonly known as: DESYREL Take 0.5-1 tablets (25-50 mg total) by mouth at bedtime as needed for sleep.         REVIEW OF SYSTEMS: A comprehensive ROS was conducted with the patient and is negative except as per HPI     OBJECTIVE:  VS: BP (!) 144/94   Pulse 68   Ht $R'5\' 4"'bl$  (1.626 m)   Wt 204 lb (92.5 kg)   SpO2 98%   BMI 35.02 kg/m    Wt Readings from Last 3 Encounters:  03/27/20 204 lb (92.5 kg)  03/22/20 204 lb (92.5 kg)  12/07/19 207 lb (93.9 kg)     EXAM: General: Pt appears well and is in NAD  Eyes: External eye exam normal without stare, lid lag or exophthalmos.  EOM intact.    Neck: General: Supple without adenopathy. Thyroid: Thyroid size normal.  No goiter or nodules appreciated.   Lungs: Clear with good BS bilat with no rales, rhonchi, or wheezes  Heart: Auscultation: RRR.  Abdomen: Normoactive bowel sounds, soft, nontender, without masses or organomegaly palpable  Extremities:  BL LE: No pretibial edema normal ROM and strength.  Skin: Hair: Texture and amount normal with gender appropriate distribution Skin Inspection: No rashes Skin Palpation: Skin temperature, texture, and thickness normal to palpation  Neuro: Cranial nerves: II - XII grossly intact  Motor: Normal strength throughout DTRs: 2+ and symmetric in UE without delay in relaxation phase  Mental Status: Judgment, insight: Intact Memory: Intact for recent and remote events Mood and affect: No depression,  anxiety, or agitation     DATA REVIEWED:   Results for Sheri West (MRN [id number]) as of 03/27/2020 10:07  Ref. Range 03/22/2020 10:23  TSH Latest Ref Range: 0.450 - 4.500 uIU/mL 1.420    ASSESSMENT/PLAN/RECOMMENDATIONS:   1. Hypothyroidism :    - Pt is clinically euthyroid  - No local neck symptoms  - - Pt educated extensively on the correct way to take levothyroxine (first thing in the morning with water, 30 minutes before eating or taking other medications). - Pt encouraged to double dose the following day if she were to miss a dose given long half-life of levothyroxine.  - Recent TSH level is normal , will recheck in 6 weeks again     Medications : Continue Levothyroxine 125 mcg daily   2. Type 2 Diabetes mellitus:    - Discussed benefits of metformin, pt interested in RD referral, this has been placed.     F/U in 4 months  Labs in 6 weeks      Signed electronically by: Mack Guise, MD  [facility]  [facility] [address] [place], [place] [address] Phone: [phone] FAX: [phone]   CC: Bari Edward, FNP [address] [place] [address] Phone: [phone] Fax: [phone]   Return to Endocrinology clinic as below: Future Appointments  Date Time Provider [place]  03/27/2020 10:50 AM Leiani Enright, Melanie Crazier, MD [facility] None  05/12/2020  9:00 AM [facility] 2 [facility]

## 2020-03-27 NOTE — Patient Instructions (Signed)

## 2020-03-30 LAB — CYTOLOGY - PAP
Adequacy: ABSENT
Chlamydia: NEGATIVE
Comment: NEGATIVE
Comment: NEGATIVE
Comment: NEGATIVE
Comment: NEGATIVE
Comment: NORMAL
Diagnosis: NEGATIVE
HSV1: NEGATIVE
HSV2: NEGATIVE
High risk HPV: NEGATIVE
Neisseria Gonorrhea: NEGATIVE
Trichomonas: NEGATIVE

## 2020-03-30 NOTE — Progress Notes (Signed)
PaP was negative for abnormalities and HPV negative. No screening needed for 5 years.

## 2020-05-08 ENCOUNTER — Other Ambulatory Visit (INDEPENDENT_AMBULATORY_CARE_PROVIDER_SITE_OTHER): Payer: Managed Care, Other (non HMO)

## 2020-05-08 ENCOUNTER — Other Ambulatory Visit: Payer: Self-pay

## 2020-05-08 DIAGNOSIS — E039 Hypothyroidism, unspecified: Secondary | ICD-10-CM | POA: Diagnosis not present

## 2020-05-08 LAB — TSH: TSH: 0.43 u[IU]/mL (ref 0.35–4.50)

## 2020-05-09 ENCOUNTER — Telehealth: Payer: Self-pay | Admitting: Internal Medicine

## 2020-05-09 ENCOUNTER — Other Ambulatory Visit: Payer: Managed Care, Other (non HMO)

## 2020-05-09 LAB — THYROID PEROXIDASE ANTIBODY: Thyroperoxidase Ab SerPl-aCnc: >900 IU/mL — ABNORMAL HIGH (ref <9)

## 2020-05-09 NOTE — Telephone Encounter (Signed)
Message left for patient to return my call.  

## 2020-05-09 NOTE — Telephone Encounter (Signed)
PLease let the pt know that her thyroid is normal and to continue levothyroxine 125 mcg daily    Thanks   Abby Nena Jordan, MD  [facility]  [facility] [address] [place], [place] [address] Phone: [phone] FAX: [phone]

## 2020-05-10 ENCOUNTER — Ambulatory Visit: Payer: Managed Care, Other (non HMO)

## 2020-05-10 NOTE — Telephone Encounter (Signed)
Message left for patient to return my call.  

## 2020-05-12 ENCOUNTER — Ambulatory Visit
Admission: RE | Admit: 2020-05-12 | Discharge: 2020-05-12 | Disposition: A | Discharge status: Home / Self Care | Payer: Managed Care, Other (non HMO) | Source: Ambulatory Visit | Attending: Urgent Care | Admitting: Urgent Care

## 2020-05-12 ENCOUNTER — Other Ambulatory Visit: Payer: Self-pay

## 2020-05-12 DIAGNOSIS — Z1231 Encounter for screening mammogram for malignant neoplasm of breast: Secondary | ICD-10-CM

## 2020-05-12 NOTE — Telephone Encounter (Signed)
Message left for patient to return my call.  

## 2020-05-15 ENCOUNTER — Encounter: Payer: Self-pay | Admitting: *Deleted

## 2020-05-17 ENCOUNTER — Encounter: Payer: Self-pay | Admitting: *Deleted

## 2020-05-17 NOTE — Telephone Encounter (Signed)
Letter sent for patient.

## 2020-05-18 ENCOUNTER — Other Ambulatory Visit: Payer: Self-pay

## 2020-05-18 ENCOUNTER — Telehealth: Payer: Self-pay | Admitting: *Deleted

## 2020-05-18 ENCOUNTER — Encounter: Payer: Managed Care, Other (non HMO) | Attending: Internal Medicine | Admitting: Dietician

## 2020-05-18 ENCOUNTER — Encounter: Payer: Self-pay | Admitting: Dietician

## 2020-05-18 DIAGNOSIS — E119 Type 2 diabetes mellitus without complications: Secondary | ICD-10-CM | POA: Diagnosis not present

## 2020-05-18 MED ORDER — ONETOUCH VERIO VI STRP: ORAL_STRIP | 5 refills | Status: AC

## 2020-05-18 MED ORDER — ONETOUCH VERIO FLEX SYSTEM W/DEVICE KIT: PACK | 0 refills | Status: AC

## 2020-05-18 NOTE — Patient Instructions (Signed)
Continue to decrease your salt intake. Bake or grill or boil rather than fry most of the time.  Continue to listen to your body.  Eat slowly.  When are you feel satisfied?  Aim for 2-3 Carb Choices per meal (30-45 grams) +/- 1 either way  Aim for 0-1 Carbs per snack if hungry  Include protein in moderation with your meals and snacks Consider reading food labels for Total Carbohydrate of foods Consider  increasing your activity level by walk for 30 minutes daily as tolerated Consider checking BG at alternate times per day  Consider taking medication as directed by MD  Blood sugar goals: 80-130 prior to your breakfast Less than 180 2 hours after you start a meal

## 2020-05-18 NOTE — Telephone Encounter (Signed)
Sent as requested.

## 2020-05-18 NOTE — Progress Notes (Addendum)
Diabetes Self-Management Education  Visit Type: First/Initial  Appt. Start Time: 0915 Appt. End Time: 1610  05/18/2020  Sheri West, identified by name and date of birth, is a 60 y.o. female with a diagnosis of Diabetes: Type 2.   ASSESSMENT Patient is here today alone.  She has recently been diagnosed with diabetes.  History includes Type 2 Diabetes, HLD, hypothyroidism. A1C 6.5%, cholesterol 122, HDL 46, LDL 60, Triglycerides 80 04/03/2019 Medications include levothyroxine, and she has been prescribed metformin but has not started this  Provided patient with blood glucose meter.  Patient was able to demonstrate use.  Blood glucose was 114.  Message sent to medical assistant to send a script for testing supplies to her pharmacy.  Meter provided:  One Touch Verio Flex Expiration 09/10/2020 Lot [id number] x  Additional One Touch Verio test strips also provided: Expiration 02/10/2021 Lot [id number]   Weight hx: 201 lbs 05/18/2020 204 lbs 03/2020 2012 lbs 01/2021  Patient lives with her husband and son.  He was diagnosed with diabetes recently as well.  They share shopping and cooking.  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  She walks a lot on her job.    Height 5\' 4"  (1.626 m), weight 201 lb (91.2 kg). Body mass index is 34.5 kg/m.   Diabetes Self-Management Education - 05/18/20 0929      Visit Information   Visit Type First/Initial      Initial Visit   Diabetes Type Type 2    Are you currently following a meal plan? No    Are you taking your medications as prescribed? No    Date Diagnosed 2021      Health Coping   How would you rate your overall health? Excellent      Psychosocial Assessment   Patient Belief/Attitude about Diabetes Motivated to manage diabetes    Self-care barriers English as a second language    Self-management support Doctor's office    Other persons present Patient     Patient Concerns Nutrition/Meal planning;Healthy Lifestyle    Special Needs Other (comment)   materials in spanish   Preferred Learning Style No preference indicated    Learning Readiness Ready    How often do you need to have someone help you when you read instructions, pamphlets, or other written materials from your doctor or pharmacy? 2 - Rarely    What is the last grade level you completed in school? school in [place] and unsure of equivalent      Pre-Education Assessment   Patient understands the diabetes disease and treatment process. Needs Instruction    Patient understands incorporating nutritional management into lifestyle. Needs Instruction    Patient undertands incorporating physical activity into lifestyle. Needs Instruction    Patient understands using medications safely. Needs Instruction    Patient understands monitoring blood glucose, interpreting and using results Needs Instruction    Patient understands prevention, detection, and treatment of acute complications. Needs Instruction    Patient understands prevention, detection, and treatment of chronic complications. Needs Instruction    Patient understands how to develop strategies to address psychosocial issues. Needs Instruction    Patient understands how to develop strategies to promote health/change behavior. Needs Instruction      Complications   Last HgB A1C per patient/outside source 6.5 %   03/2020   How often do you check your blood sugar? 0 times/day (not testing)    Have you  had a dilated eye exam in the past 12 months? No    Have you had a dental exam in the past 12 months? Yes    Are you checking your feet? No      Dietary Intake   Breakfast cereal, whole milk or oatmeal and 1 apple and 1 banana   9:30   Snack (morning) none    Lunch salad with chicken and ranch dressing  OR ham sandwich on honey wheat, small amount of mayo, lettuce, tomato AND occasional apple   1:30   Snack (afternoon) granola bar    Dinner  fried noodle soup, fried chicken breast, tomatoes OR meat, vegetables, rice   7:40   Snack (evening) fresh pineapple    Beverage(s) water, cucumber water, coffee with 1 tsp sugar      Exercise   Exercise Type Light (walking / raking leaves)   working     Patient Education   Previous Diabetes Education No    Disease state  Definition of diabetes, type 1 and 2, and the diagnosis of diabetes    Nutrition management  Role of diet in the treatment of diabetes and the relationship between the three main macronutrients and blood glucose level;Food label reading, portion sizes and measuring food.;Meal options for control of blood glucose level and chronic complications.;Information on hints to eating out and maintain blood glucose control.    Physical activity and exercise  Role of exercise on diabetes management, blood pressure control and cardiac health.    Medications Reviewed patients medication for diabetes, action, purpose, timing of dose and side effects.    Monitoring Taught/evaluated SMBG meter.;Purpose and frequency of SMBG.;Taught/discussed recording of test results and interpretation of SMBG.;Identified appropriate SMBG and/or A1C goals.;Daily foot exams    Acute complications Taught treatment of hypoglycemia - the 15 rule.    Chronic complications Retinopathy and reason for yearly dilated eye exams    Psychosocial adjustment Role of stress on diabetes      Individualized Goals (developed by patient)   Nutrition General guidelines for healthy choices and portions discussed    Physical Activity 30 minutes per day;Exercise 5-7 days per week    Medications take my medication as prescribed    Monitoring  test my blood glucose as discussed;Other (comment)    Reducing Risk do foot checks daily;Other (comment)   decrease total fat     Post-Education Assessment   Patient understands the diabetes disease and treatment process. Demonstrates understanding / competency    Patient understands  incorporating nutritional management into lifestyle. Needs Review    Patient undertands incorporating physical activity into lifestyle. Demonstrates understanding / competency    Patient understands using medications safely. Needs Review    Patient understands monitoring blood glucose, interpreting and using results Needs Review    Patient understands prevention, detection, and treatment of acute complications. Needs Review    Patient understands prevention, detection, and treatment of chronic complications. Needs Review    Patient understands how to develop strategies to address psychosocial issues. Demonstrates understanding / competency    Patient understands how to develop strategies to promote health/change behavior. Needs Review      Outcomes   Expected Outcomes Demonstrated interest in learning. Expect positive outcomes    Future DMSE 2 months    Program Status Not Completed           Individualized Plan for Diabetes Self-Management Training:   Learning Objective:  Patient will have a greater understanding of diabetes self-management. Patient education  plan is to attend individual and/or group sessions per assessed needs and concerns.   Plan:   Patient Instructions  Continue to decrease your salt intake. Bake or grill or boil rather than fry most of the time.  Continue to listen to your body.  Eat slowly.  When are you feel satisfied?  Aim for 2-3 Carb Choices per meal (30-45 grams) +/- 1 either way  Aim for 0-1 Carbs per snack if hungry  Include protein in moderation with your meals and snacks Consider reading food labels for Total Carbohydrate of foods Consider  increasing your activity level by walk for 30 minutes daily as tolerated Consider checking BG at alternate times per day  Consider taking medication as directed by MD  Blood sugar goals: 80-130 prior to your breakfast Less than 180 2 hours after you start a meal      Expected Outcomes:  Demonstrated  interest in learning. Expect positive outcomes  Education material provided: ADA - How to Thrive: A Guide for Your Journey with Diabetes (English), Meal plan card (Spanish) and My Plate (Spanish), Hypoglycemia (Spanish)  If problems or questions, patient to contact team via:  Phone  Future DSME appointment: 2 months

## 2020-05-18 NOTE — Telephone Encounter (Signed)
-----   Message from Clydell Hakim, RD sent at 05/18/2020  3:13 PM EDT ----- Regarding: prescription request Please send a prescription for strips and lancets to [facility] for the One Touch Verio Flex blood glucose meter.  Thanks, Mickel Baas

## 2020-06-22 DIAGNOSIS — M5030 Other cervical disc degeneration, unspecified cervical region: Secondary | ICD-10-CM | POA: Insufficient documentation

## 2020-07-27 ENCOUNTER — Ambulatory Visit: Payer: Managed Care, Other (non HMO) | Admitting: Internal Medicine

## 2020-07-27 ENCOUNTER — Encounter: Payer: Self-pay | Admitting: Dietician

## 2020-07-27 ENCOUNTER — Other Ambulatory Visit: Payer: Self-pay

## 2020-07-27 ENCOUNTER — Encounter: Payer: Managed Care, Other (non HMO) | Attending: Internal Medicine | Admitting: Dietician

## 2020-07-27 ENCOUNTER — Encounter: Payer: Self-pay | Admitting: Internal Medicine

## 2020-07-27 VITALS — BP 140/88 | HR 86 | Ht 64.0 in | Wt 202.0 lb

## 2020-07-27 DIAGNOSIS — E039 Hypothyroidism, unspecified: Secondary | ICD-10-CM | POA: Diagnosis not present

## 2020-07-27 DIAGNOSIS — E119 Type 2 diabetes mellitus without complications: Secondary | ICD-10-CM | POA: Diagnosis not present

## 2020-07-27 LAB — TSH: TSH: 34.11 u[IU]/mL — ABNORMAL HIGH (ref 0.35–4.50)

## 2020-07-27 LAB — HEMOGLOBIN A1C: Hgb A1c MFr Bld: 6.8 % — ABNORMAL HIGH (ref 4.6–6.5)

## 2020-07-27 MED ORDER — LEVOTHYROXINE SODIUM 125 MCG PO TABS: 125.0000 ug | ORAL_TABLET | Freq: Every day | ORAL | 2 refills | Status: DC

## 2020-07-27 NOTE — Progress Notes (Signed)
Name: Sheri West  MRN/ DOB: [id number], [birth year]    Age/ Sex: 60 y.o., female     PCP: Just, Azalee Course, FNP (Inactive)   Reason for Endocrinology Evaluation: Hypothyroidism     Initial Endocrinology Clinic Visit: 03/27/2020    PATIENT IDENTIFIER: Sheri West is a 60 y.o., female with a past medical history of T2DM, Hypothyroidism and dyslipiemia. She has followed with [place] [facility] since 03/27/2020 for consultative assistance with management of her Hypothyroidism.   THYROID  SUMMARY:  She has been diagnosed with hypothyroidism > 20 yrs ago. Denies prior neck surgery or radiation Unknown FH of thyroid disease   DIABETES HISTORY:  She has been diagnosed with DM in 2021 and started on Metformin. A1c acceptable. Tolerating metformin without side effects .   SUBJECTIVE:    Today (07/27/2020):  Sheri West is here for Hypothyroidism and diabetes. Checks glucose 0 times a day.     She had stopped taking Levothyroxine for 2 weeks, she tells me she was out of refills    Weight has been increase She has fatigue  Denies constipation  Has occasional local neck swelling      Levothyroxine 125 mcg daily - not taking  Metformin 500 mg, 2 tablets in the morning     HISTORY:  Past Medical History:  Past Medical History:  Diagnosis Date   Allergy    Diabetes mellitus without complication (HCC)    GERD (gastroesophageal reflux disease)    Hyperlipidemia    Thyroid disease    Past Surgical History:  Past Surgical History:  Procedure Laterality Date   FOOT SURGERY     REDUCTION MAMMAPLASTY     SHOULDER SURGERY     Social History:  reports that she has quit smoking. She has never used smokeless tobacco. She reports that she does not drink alcohol and does not use drugs. Family History:  Family History  Problem Relation Age of Onset   Colon cancer Neg Hx    Colon polyps Neg Hx    Esophageal cancer Neg Hx    Rectal cancer Neg Hx     Stomach cancer Neg Hx      HOME MEDICATIONS: Allergies as of 07/27/2020   No Known Allergies      Medication List        Accurate as of July 27, 2020  9:28 AM. If you have any questions, ask your nurse or doctor.          blood glucose meter kit and supplies Dispense based on patient and insurance preference. Use up to four times daily as directed. (FOR ICD-10 E10.9, E11.9).   levothyroxine 125 MCG tablet Commonly known as: SYNTHROID Take 125 mcg by mouth daily.   metFORMIN 500 MG tablet Commonly known as: GLUCOPHAGE Take 2 tablets (1,000 mg total) by mouth daily with breakfast.   OneTouch Verio Flex System w/Device Kit Use as instructed to check blood sugar up 2 times daily   OneTouch Verio test strip Generic drug: glucose blood Use as instructed to check blood sugar up 2 times daily   rosuvastatin 10 MG tablet Commonly known as: Crestor Take 1 tablet (10 mg total) by mouth daily.   traZODone 50 MG tablet Commonly known as: DESYREL Take 0.5-1 tablets (25-50 mg total) by mouth at bedtime as needed for sleep.          OBJECTIVE:   PHYSICAL EXAM: VS: BP 140/88   Pulse 86   Ht 5\' 4"  (1.626 m)  Wt 202 lb (91.6 kg)   SpO2 94%   BMI 34.67 kg/m    EXAM: General: Pt appears well and is in NAD  Neck: General: Supple without adenopathy. Thyroid: Thyroid size normal.  No goiter or nodules appreciated.   Lungs: Clear with good BS bilat with no rales, rhonchi, or wheezes  Heart: Auscultation: RRR.  Abdomen: Normoactive bowel sounds, soft, nontender, without masses or organomegaly palpable  Extremities:  BL LE: Trace  pretibial edema normal ROM and strength.  Mental Status: Judgment, insight: Intact Mood and affect: No depression, anxiety, or agitation     DATA REVIEWED: Results for Sheri, West (MRN [id number]) as of 07/27/2020 19:03  Ref. Range 07/27/2020 10:50  Hemoglobin A1C Latest Ref Range: 4.6 - 6.5 % 6.8 (H)  TSH Latest Ref Range: 0.35 -  4.50 uIU/mL 34.11 (H)    Results for Sheri, West (MRN [id number]) as of 07/27/2020 09:28  Ref. Range 05/08/2020 09:42  Thyroperoxidase Ab SerPl-aCnc Latest Ref Range: <9 IU/mL >900 (H)    ASSESSMENT / PLAN / RECOMMENDATIONS:   Hashimoto's Thyroiditis   - She has been out of LT-4 replacement for 2 weeks, I have strongly discouraged her from allowing herself to run out of levothyroxine, I explained her risk of myxoedema coma.  - Will restart Levothyroxine as below  - TSh elevated as expected    Medications  Restart Levothyroxine 125 mcg daily    2. T2DM, Optimally controlled :   - Pt is questioning this diagnosis, stating someone at the hospital told her she does not have diabetes. I confirmed to the pt her T2DM with an A1c of 6.5 %  - Her A1c is trending up and I am concerned of compliance issues  - She is meeting with our RD today   F/U in 6 months   Signed electronically by: Mack Guise, MD  [facility]  [facility] [address] [place], [place] [address] Phone: [phone] FAX: [phone]      CC: Just, Laurita Quint, FNP (Inactive) No address on file Phone: None  Fax: None   Return to Endocrinology clinic as below: Future Appointments  Date Time Provider [place]  07/27/2020  9:50 AM Sheri West, Melanie Crazier, MD [facility] None  07/27/2020 10:30 AM Valora Piccolo, Barnabas Lister, RD [facility] NDM

## 2020-07-27 NOTE — Patient Instructions (Addendum)
1 pack of oatmeal for breakfast with berries or apple and boiled egg or nuts.  Add vegetables to your lunch. (Cucumbers, peppers, carrots, broccoli, cauliflower, spinach, or others).  Dinner:  cooked vegetables, lean meat or beans  Limit snacking  Talk to help with stress.

## 2020-07-27 NOTE — Progress Notes (Signed)
Diabetes Self-Management Education  Visit Type: Follow-up  Appt. Start Time: 1015 Appt. End Time: 1660  07/27/2020  [name], identified by name and date of birth, is a 60 y.o. female with a diagnosis of Diabetes:  .   ASSESSMENT Patient is here today alone.  She was last seen by myself 05/18/2020. She wants to learn how to eat.  She states that she is depressed.  Her husband was just diagnosed with cancer of the esophagus and she states that she is to start chemo, radiation therapy, and needs surgery.  She has not taken her levothyroxine for the last 2 weeks as she was out. She was not checking her blood glucose, occasionally has someone do this at work.  She states that she has been focused on her husband and has not been taking care of herself. Blood Glucose monitoring reviewed with patient. Glucose currently 113.  She has not had breakfast.  She states that she has been trying to not eat a lot of sugar.  Avoids beef as this causes pain in her knees. Skips dinner at times as her husband does not want to or can't eat.  History includes Type 2 Diabetes, HLD, hypothyroidism. A1C 6.5%, cholesterol 122, HDL 46, LDL 60, Triglycerides 80 04/03/2019 Medications include levothyroxine, and she has been prescribed metformin but has not started this  Weight hx: 202 lbs 07/27/2020 201 lbs 05/18/2020 204 lbs 03/2020 2012 lbs 01/2021   Patient lives with her husband and son.  He was diagnosed with diabetes recently as well.  They share shopping and cooking.  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  She walks a lot on her job.  Weight 202 lb (91.6 kg). Body mass index is 34.67 kg/m.   Diabetes Self-Management Education - 07/27/20 1051       Visit Information   Visit Type Follow-up      Psychosocial Assessment   Patient Belief/Attitude about Diabetes Other (comment)   stressed with life   Self-care  barriers English as a second language    Self-management support Doctor's office    Other persons present Patient    Patient Concerns Nutrition/Meal planning    Special Needs None    Preferred Learning Style No preference indicated    Learning Readiness Ready    How often do you need to have someone help you when you read instructions, pamphlets, or other written materials from your doctor or pharmacy? 2 - Rarely      Pre-Education Assessment   Patient understands the diabetes disease and treatment process. Demonstrates understanding / competency    Patient understands incorporating nutritional management into lifestyle. Needs Review    Patient undertands incorporating physical activity into lifestyle. Needs Review    Patient understands using medications safely. Needs Review    Patient understands monitoring blood glucose, interpreting and using results Needs Review    Patient understands prevention, detection, and treatment of acute complications. Demonstrates understanding / competency    Patient understands prevention, detection, and treatment of chronic complications. Demonstrates understanding / competency    Patient understands how to develop strategies to address psychosocial issues. Needs Review    Patient understands how to develop strategies to promote health/change behavior. Needs Review      Complications   How often do you check your blood sugar? --   occasionally   Fasting Blood glucose range (mg/dL) 70-129      Dietary  Intake   Breakfast 2 packets of instant oatmeal, occasional yogurt    Lunch salad with chicken and ranch or ham sandwich    Snack (afternoon) ritz crackers or apple or pear    Dinner skips at times OR green beans and eggs    Snack (evening) occasional fruit    Beverage(s) water, cucumber water, coffee with 1 tsp sugar      Exercise   Exercise Type Light (walking / raking leaves)      Patient Education   Previous Diabetes Education Yes (please  comment)   05/2020   Nutrition management  Meal options for control of blood glucose level and chronic complications.    Physical activity and exercise  Role of exercise on diabetes management, blood pressure control and cardiac health.    Medications Reviewed patients medication for diabetes, action, purpose, timing of dose and side effects.    Monitoring Taught/evaluated SMBG meter.;Identified appropriate SMBG and/or A1C goals.    Psychosocial adjustment Role of stress on diabetes;Identified and addressed patients feelings and concerns about diabetes      Individualized Goals (developed by patient)   Nutrition General guidelines for healthy choices and portions discussed    Physical Activity Exercise 3-5 times per week;30 minutes per day;Exercise 5-7 days per week    Medications take my medication as prescribed      Patient Self-Evaluation of Goals - Patient rates self as meeting previously set goals (% of time)   Nutrition >75%    Physical Activity 50 - 75 %    Medications 50 - 75 %    Monitoring < 25%    Problem Solving 25 - 50%    Reducing Risk >75%    Health Coping 50 - 75 %      Post-Education Assessment   Patient understands the diabetes disease and treatment process. Demonstrates understanding / competency    Patient understands incorporating nutritional management into lifestyle. Needs Review    Patient undertands incorporating physical activity into lifestyle. Demonstrates understanding / competency    Patient understands using medications safely. Demonstrates understanding / competency    Patient understands monitoring blood glucose, interpreting and using results Needs Review    Patient understands prevention, detection, and treatment of acute complications. Demonstrates understanding / competency    Patient understands prevention, detection, and treatment of chronic complications. Demonstrates understanding / competency    Patient understands how to develop strategies to  address psychosocial issues. Needs Review    Patient understands how to develop strategies to promote health/change behavior. Needs Review      Outcomes   Expected Outcomes Demonstrated interest in learning. Expect positive outcomes    Future DMSE 3-4 months    Program Status Not Completed      Subsequent Visit   Since your last visit have you continued or begun to take your medications as prescribed? Yes    Since your last visit have you experienced any weight changes? Gain    Weight Gain (lbs) 1    Since your last visit, are you checking your blood glucose at least once a day? No             Individualized Plan for Diabetes Self-Management Training:   Learning Objective:  Patient will have a greater understanding of diabetes self-management. Patient education plan is to attend individual and/or group sessions per assessed needs and concerns.   Plan:   Patient Instructions  1 pack of oatmeal for breakfast with berries or apple and boiled egg  or nuts.  Add vegetables to your lunch. (Cucumbers, peppers, carrots, broccoli, cauliflower, spinach, or others).  Dinner:  cooked vegetables, lean meat or beans  Limit snacking  Talk to help with stress.   Expected Outcomes:  Demonstrated interest in learning. Expect positive outcomes  Education material provided:   If problems or questions, patient to contact team via:  Phone  Future DSME appointment: 3-4 months

## 2020-07-28 ENCOUNTER — Encounter: Payer: Self-pay | Admitting: Internal Medicine

## 2020-10-26 ENCOUNTER — Encounter: Payer: Managed Care, Other (non HMO) | Attending: Internal Medicine | Admitting: Dietician

## 2020-11-29 ENCOUNTER — Ambulatory Visit: Payer: Managed Care, Other (non HMO) | Admitting: Internal Medicine

## 2020-11-29 ENCOUNTER — Encounter: Payer: Self-pay | Admitting: Internal Medicine

## 2020-11-29 ENCOUNTER — Other Ambulatory Visit: Payer: Self-pay

## 2020-11-29 VITALS — BP 132/80 | HR 88 | Ht 64.0 in | Wt 205.0 lb

## 2020-11-29 DIAGNOSIS — E039 Hypothyroidism, unspecified: Secondary | ICD-10-CM | POA: Diagnosis not present

## 2020-11-29 DIAGNOSIS — E119 Type 2 diabetes mellitus without complications: Secondary | ICD-10-CM | POA: Diagnosis not present

## 2020-11-29 DIAGNOSIS — E063 Autoimmune thyroiditis: Secondary | ICD-10-CM | POA: Insufficient documentation

## 2020-11-29 DIAGNOSIS — Z23 Encounter for immunization: Secondary | ICD-10-CM | POA: Diagnosis not present

## 2020-11-29 LAB — TSH: TSH: 14.92 u[IU]/mL — ABNORMAL HIGH (ref 0.35–5.50)

## 2020-11-29 LAB — POCT GLYCOSYLATED HEMOGLOBIN (HGB A1C): Hemoglobin A1C: 6.7 % — AB (ref 4.0–5.6)

## 2020-11-29 LAB — GLUCOSE, POCT (MANUAL RESULT ENTRY): POC Glucose: 133 mg/dl — AB (ref 70–99)

## 2020-11-29 MED ORDER — METFORMIN HCL ER 500 MG PO TB24: 1000.0000 mg | ORAL_TABLET | Freq: Every day | ORAL | 3 refills | Status: DC

## 2020-11-29 NOTE — Progress Notes (Signed)
Name: Sheri West  MRN/ DOB: [id number], [birth year]    Age/ Sex: 60 y.o., female     PCP: Just, Azalee Course, FNP (Inactive)   Reason for Endocrinology Evaluation: Hypothyroidism     Initial Endocrinology Clinic Visit: 03/27/2020    PATIENT IDENTIFIER: [name] is a 60 y.o., female with a past medical history of T2DM, Hypothyroidism and dyslipiemia. She has followed with [facility] since 03/27/2020 for consultative assistance with management of her Hypothyroidism.   THYROID  SUMMARY:  She has been diagnosed with hypothyroidism > 20 yrs ago. Denies prior neck surgery or radiation Unknown FH of thyroid disease   DIABETES HISTORY:  She has been diagnosed with DM in 2021 and started on Metformin. A1c has ranged between 6.5 % in 03/2020 to 6.8 % in 07/2020   She has been on Metformin since diagnosis and tolerating it well   SUBJECTIVE:    Today (11/29/2020):  Sheri West is here for Hypothyroidism and diabetes. Checks glucose 0 times a day.     She has been compliant with LT-4 replacement   Weight has been stable   Denies constipation or diarrhea  Has polydipsia but no loca neck swelling  She has difficulty avoiding sugar- sweetened beverages        Levothyroxine 125 mcg daily Metformin 500 mg, 2 tablets in the morning     HISTORY:  Past Medical History:  Past Medical History:  Diagnosis Date   Allergy    Diabetes mellitus without complication (HCC)    GERD (gastroesophageal reflux disease)    Hyperlipidemia    Thyroid disease    Past Surgical History:  Past Surgical History:  Procedure Laterality Date   FOOT SURGERY     REDUCTION MAMMAPLASTY     SHOULDER SURGERY     Social History:  reports that she has quit smoking. She has never used smokeless tobacco. She reports that she does not drink alcohol and does not use drugs. Family History:  Family History  Problem Relation Age of Onset   Colon cancer Neg Hx    Colon  polyps Neg Hx    Esophageal cancer Neg Hx    Rectal cancer Neg Hx    Stomach cancer Neg Hx      HOME MEDICATIONS: Allergies as of 11/29/2020   No Known Allergies      Medication List        Accurate as of November 29, 2020  8:38 AM. If you have any questions, ask your nurse or doctor.          STOP taking these medications    metFORMIN 500 MG tablet Commonly known as: GLUCOPHAGE Replaced by: metFORMIN 500 MG 24 hr tablet Stopped by: Scarlette Shorts, MD       TAKE these medications    blood glucose meter kit and supplies Dispense based on patient and insurance preference. Use up to four times daily as directed. (FOR ICD-10 E10.9, E11.9).   levothyroxine 125 MCG tablet Commonly known as: SYNTHROID Take 1 tablet (125 mcg total) by mouth daily.   metFORMIN 500 MG 24 hr tablet Commonly known as: GLUCOPHAGE-XR Take 2 tablets (1,000 mg total) by mouth daily with breakfast. Replaces: metFORMIN 500 MG tablet Started by: Scarlette Shorts, MD   OneTouch Verio Flex System w/Device Kit Use as instructed to check blood sugar up 2 times daily   OneTouch Verio test strip Generic drug: glucose blood Use as instructed to check blood sugar up 2  times daily   rosuvastatin 10 MG tablet Commonly known as: Crestor Take 1 tablet (10 mg total) by mouth daily.   traZODone 50 MG tablet Commonly known as: DESYREL Take 0.5-1 tablets (25-50 mg total) by mouth at bedtime as needed for sleep.          OBJECTIVE:   PHYSICAL EXAM: VS: BP 132/80 (BP Location: Left Arm, Patient Position: Sitting, Cuff Size: Small)   Pulse 88   Ht $R'5\' 4"'Tu$  (1.626 m)   Wt 205 lb (93 kg)   SpO2 96%   BMI 35.19 kg/m    EXAM: General: Pt appears well and is in NAD  Neck: General: Supple without adenopathy. Thyroid: Thyroid size normal.  No goiter or nodules appreciated.   Lungs: Clear with good BS bilat with no rales, rhonchi, or wheezes  Heart: Auscultation: RRR.  Abdomen:  Normoactive bowel sounds, soft, nontender, without masses or organomegaly palpable  Extremities:  BL LE: Trace  pretibial edema normal ROM and strength.  Mental Status: Judgment, insight: Intact Mood and affect: No depression, anxiety, or agitation     DATA REVIEWED: Results for Sheri, West (MRN [id number]) as of 11/30/2020 08:32  Ref. Range 11/29/2020 08:44  TSH Latest Ref Range: 0.35 - 5.50 uIU/mL 14.92 (H)     Results for Sheri, West (MRN [id number]) as of 07/27/2020 09:28  Ref. Range 05/08/2020 09:42  Thyroperoxidase Ab SerPl-aCnc Latest Ref Range: <9 IU/mL >900 (H)    ASSESSMENT / PLAN / RECOMMENDATIONS:   Hashimoto's Thyroiditis   - Pt is clinically euthyroid  - NO local neck symptoms  -TSH elevated will increase, assuming she has been taking it as prescribed her levothyroxine dose will need to be changed as below   Medications  Stop levothyroxine 125 mcg daily  Start levothyroxine 137 MCG daily   2. T2DM, Optimally controlled :   - Her A1c is at goal  - We discussed avoiding sugar-sweetened beverages  - Will switch to XR metformin    Medications  Metformin 500 mg XR , 2 tabs QAM      F/U in 6 months   Signed electronically by: Mack Guise, MD  [facility]  [facility] [address] [place], [place] [address] Phone: [phone] FAX: [phone]      CC: Just, Laurita Quint, FNP (Inactive) No address on file Phone: None  Fax: None   Return to Endocrinology clinic as below: No future appointments.

## 2020-11-30 MED ORDER — LEVOTHYROXINE SODIUM 137 MCG PO TABS: 137.0000 ug | ORAL_TABLET | Freq: Every day | ORAL | 2 refills | Status: DC

## 2021-03-08 ENCOUNTER — Other Ambulatory Visit: Payer: Self-pay | Admitting: Internal Medicine

## 2021-05-30 ENCOUNTER — Ambulatory Visit: Payer: Managed Care, Other (non HMO) | Admitting: Internal Medicine

## 2021-05-30 NOTE — Progress Notes (Deleted)
Name: Sheri West  Age/ Sex: 61 y.o., female   MRN/ DOB: [id number], 1960/11/07     PCP: Just, Laurita Quint, FNP (Inactive)   Reason for Endocrinology Evaluation: Type 2 Diabetes Mellitus/ Hypothyroidism  Initial Endocrine Consultative Visit: 11/29/2020    PATIENT IDENTIFIER: Sheri West is a 61 y.o. female with a past medical history of T2DM, Hashimoto's thyroiditis and dyslipidemia. The patient has followed with Endocrinology clinic since 11/29/2020 for consultative assistance with management of her diabetes.  DIABETIC HISTORY:  Sheri West was diagnosed with DM in 2021, she was started on metformin at the time. Her hemoglobin A1c has ranged from 6.5% in in 03/2020, peaking at 6.8% in 07/2020.    THYROID HISTORY: She has been diagnosed with hypothyroidism decades ago.  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  She checks her blood sugars *** times daily, preprandial to breakfast and ***. The patient has not had hypoglycemic episodes since the last clinic visit.   HOME ENDOCRINE REGIMEN:  Levothyroxine 137 mcg daily Metformin 500 mg 2 tabs daily    Statin: *** ACE-I/ARB: ***   METER DOWNLOAD SUMMARY: Date range evaluated: *** Fingerstick Blood Glucose Tests = *** Average Number Tests/Day = *** Overall Mean FS Glucose = *** Standard Deviation = ***  BG Ranges: Low = *** High = ***   Hypoglycemic Events/30 Days: BG < 50 = *** Episodes of symptomatic severe hypoglycemia = ***    DIABETIC COMPLICATIONS: Microvascular complications:  *** Denies: CKD, retinopathy Last Eye Exam: Completed   Macrovascular complications:   Denies: CAD, CVA, PVD   HISTORY:  Past Medical History:  Past Medical History:  Diagnosis Date   Allergy     Diabetes mellitus without complication (HCC)    GERD (gastroesophageal reflux disease)    Hyperlipidemia    Thyroid disease    Past Surgical History:  Past Surgical History:  Procedure Laterality Date   FOOT SURGERY     REDUCTION MAMMAPLASTY     SHOULDER SURGERY     Social History:  reports that she has quit smoking. She has never used smokeless tobacco. She reports that she does not drink alcohol and does not use drugs. Family History:  Family History  Problem Relation Age of Onset   Colon cancer Neg Hx    Colon polyps Neg Hx    Esophageal cancer Neg Hx    Rectal cancer Neg Hx    Stomach cancer Neg Hx      HOME MEDICATIONS: Allergies as of 05/30/2021   No Known Allergies      Medication List        Accurate as of May 30, 2021  7:09 AM. If you have any questions, ask your nurse or doctor.          blood glucose meter kit and supplies Dispense based on patient and insurance preference. Use up to four times daily as directed. (FOR ICD-10 E10.9, E11.9).   levothyroxine 137 MCG tablet Commonly known as: SYNTHROID TAKE 1 TABLET(137 MCG) BY MOUTH DAILY BEFORE AND BREAKFAST   metFORMIN 500 MG 24 hr tablet Commonly known as: GLUCOPHAGE-XR Take 2 tablets (1,000 mg total) by mouth daily with breakfast.   OneTouch Verio Flex System w/Device Kit Use as instructed to check blood sugar up 2 times daily  OneTouch Verio test strip Generic drug: glucose blood Use as instructed to check blood sugar up 2 times daily   rosuvastatin 10 MG tablet Commonly known as: Crestor Take 1 tablet (10 mg total) by mouth daily.   traZODone 50 MG tablet Commonly known as: DESYREL Take 0.5-1 tablets (25-50 mg total) by mouth at bedtime as needed for sleep.         OBJECTIVE:   Vital Signs: There were no vitals taken for this visit.  Wt Readings from Last 3 Encounters:  11/29/20 205 lb (93 kg)  07/27/20 202 lb (91.6 kg)  07/27/20 202 lb (91.6 kg)     Exam: General: Pt  appears well and is in NAD  Hydration: Well-hydrated with moist mucous membranes and good skin turgor  HEENT: Head: Unremarkable with good dentition. Oropharynx clear without exudate.  Eyes: External eye exam normal without stare, lid lag or exophthalmos.  EOM intact.  PERRL.  Neck: General: Supple without adenopathy. Thyroid: Thyroid size normal.  No goiter or nodules appreciated. No thyroid bruit.  Lungs: Clear with good BS bilat with no rales, rhonchi, or wheezes  Heart: RRR with normal S1 and S2 and no gallops; no murmurs; no rub  Abdomen: Normoactive bowel sounds, soft, nontender, without masses or organomegaly palpable  Extremities: No pretibial edema. No tremor. Normal strength and motion throughout. See detailed diabetic foot exam below.  Skin: Normal texture and temperature to palpation. No rash noted. No Acanthosis nigricans/skin tags. No lipohypertrophy.  Neuro: MS is good with appropriate affect, pt is alert and Ox3    DM foot exam: Please see diabetic assessment flow-sheet detailed below:           DATA REVIEWED:  Lab Results  Component Value Date   HGBA1C 6.7 (A) 11/29/2020   HGBA1C 6.8 (H) 07/27/2020   HGBA1C 6.5 (H) 03/22/2020   Lab Results  Component Value Date   LDLCALC 60 03/22/2020   CREATININE 0.64 12/07/2019   Lab Results  Component Value Date   MICRALBCREAT 24 12/07/2019     Lab Results  Component Value Date   CHOL 122 03/22/2020   HDL 46 03/22/2020   LDLCALC 60 03/22/2020   TRIG 80 03/22/2020   CHOLHDL 2.7 03/22/2020         ASSESSMENT / PLAN / RECOMMENDATIONS:   1) Type 2 Diabetes Mellitus, optimally controlled, With out complications - Most recent A1c of *** %. Goal A1c <7.0%.  Plan: MEDICATIONS: Metformin 500 mg XR 2 tabs daily  EDUCATION / INSTRUCTIONS: BG monitoring instructions: Patient is instructed to check her blood sugars 1 times a day, fasting.   2) Diabetic complications:  Eye: Does not have known diabetic retinopathy.   Neuro/ Feet: Does not have known diabetic peripheral neuropathy .  Renal: Patient does not have known baseline CKD. She   is *** on an ACEI/ARB at present.  3) Hashimoto's thyroiditis:    Medication Levothyroxine 137 mcg daily    Signed electronically by: Mack Guise, MD  [facility]  [facility] [address] [place], [place] [address] Phone: [phone] FAX: [phone]   CC: Just, Laurita Quint, FNP (Inactive) No address on file Phone: None  Fax: None  Return to Endocrinology clinic as below: Future Appointments  Date Time Provider [place]  05/30/2021  8:30 AM Shaylynn Nulty, Melanie Crazier, MD [facility] None

## 2021-06-18 ENCOUNTER — Other Ambulatory Visit: Payer: Self-pay | Admitting: Internal Medicine

## 2021-06-18 ENCOUNTER — Other Ambulatory Visit: Payer: Self-pay | Admitting: Emergency Medicine

## 2021-06-18 DIAGNOSIS — Z1231 Encounter for screening mammogram for malignant neoplasm of breast: Secondary | ICD-10-CM

## 2021-06-19 ENCOUNTER — Telehealth: Payer: Self-pay | Admitting: Internal Medicine

## 2021-06-19 ENCOUNTER — Encounter: Payer: Self-pay | Admitting: Internal Medicine

## 2021-06-19 ENCOUNTER — Ambulatory Visit: Payer: 59 | Admitting: Internal Medicine

## 2021-06-19 VITALS — BP 116/72 | HR 65 | Ht 64.0 in | Wt 204.8 lb

## 2021-06-19 DIAGNOSIS — E119 Type 2 diabetes mellitus without complications: Secondary | ICD-10-CM

## 2021-06-19 DIAGNOSIS — E063 Autoimmune thyroiditis: Secondary | ICD-10-CM

## 2021-06-19 DIAGNOSIS — R739 Hyperglycemia, unspecified: Secondary | ICD-10-CM

## 2021-06-19 LAB — LIPID PANEL
Cholesterol: 149 mg/dL (ref 0–200)
HDL: 42.6 mg/dL (ref >39.00)
LDL Cholesterol: 86 mg/dL (ref 0–99)
NonHDL: 106.45
Total CHOL/HDL Ratio: 3
Triglycerides: 104 mg/dL (ref 0.0–149.0)
VLDL: 20.8 mg/dL (ref 0.0–40.0)

## 2021-06-19 LAB — POCT GLYCOSYLATED HEMOGLOBIN (HGB A1C): Hemoglobin A1C: 6.7 % — AB (ref 4.0–5.6)

## 2021-06-19 LAB — MICROALBUMIN / CREATININE URINE RATIO
Creatinine,U: 185.1 mg/dL
Microalb Creat Ratio: 2.4 mg/g (ref 0.0–30.0)
Microalb, Ur: 4.5 mg/dL — ABNORMAL HIGH (ref 0.0–1.9)

## 2021-06-19 LAB — POCT GLUCOSE (DEVICE FOR HOME USE): Glucose Fasting, POC: 124 mg/dL — AB (ref 70–99)

## 2021-06-19 LAB — BASIC METABOLIC PANEL
BUN: 14 mg/dL (ref 6–23)
CO2: 28 mEq/L (ref 19–32)
Calcium: 9.3 mg/dL (ref 8.4–10.5)
Chloride: 104 mEq/L (ref 96–112)
Creatinine, Ser: 0.9 mg/dL (ref 0.40–1.20)
GFR: 68.7 mL/min (ref >60.00)
Glucose, Bld: 119 mg/dL — ABNORMAL HIGH (ref 70–99)
Potassium: 4.1 mEq/L (ref 3.5–5.1)
Sodium: 139 mEq/L (ref 135–145)

## 2021-06-19 LAB — T4, FREE: Free T4: 1.21 ng/dL (ref 0.60–1.60)

## 2021-06-19 LAB — TSH: TSH: 5.26 u[IU]/mL (ref 0.35–5.50)

## 2021-06-19 MED ORDER — METFORMIN HCL ER 500 MG PO TB24: 1000.0000 mg | ORAL_TABLET | Freq: Every day | ORAL | 3 refills | Status: DC

## 2021-06-19 NOTE — Telephone Encounter (Signed)
Please let the patient know that her kidney function, cholesterol, and urine test are normal but her thyroid is off and she needs to stop levothyroxine 137 and start levothyroxine 150 mcg daily ? ? ? ?Thanks ?

## 2021-06-19 NOTE — Progress Notes (Signed)
?Name: Sheri West  ?Age/ Sex: 61 y.o., female   ?MRN/ DOB: [id number], 01/16/1961    ? ?PCP: Just, Laurita Quint, FNP (Inactive)   ?Reason for Endocrinology Evaluation: Type 2 Diabetes Mellitus/ Hypothyroidism  ?Initial Endocrine Consultative Visit: 11/29/2020  ? ? ?PATIENT IDENTIFIER: Sheri West is a 61 y.o. female with a past medical history of T2DM, Hashimoto's thyroiditis and dyslipidemia. The patient has followed with Endocrinology clinic since 11/29/2020 for consultative assistance with management of her diabetes. ? ?DIABETIC HISTORY:  ?Sheri West was diagnosed with DM in 2021, she was started on metformin at the time. Her hemoglobin A1c has ranged from 6.5% in in 03/2020, peaking at 6.8% in 07/2020. ? ? ? ?THYROID HISTORY: ?She has been diagnosed with hypothyroidism decades ago.  She has no prior history of neck surgery no radiation ?And no family history of thyroid disease ? ?Anti-TPO antibodies were elevated> 900 IU/mL ? ?SUBJECTIVE:  ? ? ? ?Today (06/19/2021): Sheri West is here for follow-up on diabetes and Hashimoto's thyroiditis.  She checks her blood sugars 0 times daily ? ?She has headaches for the past 3 days, has mild nasal congestion and sore throat ? ?Denies nausea or other GI symptoms  ? ? ? ?HOME ENDOCRINE REGIMEN:  ?Levothyroxine 137 mcg daily ?Metformin 500 mg 2 tabs daily ? ? ? ?Statin: yes ?ACE-I/ARB: no ? ? ?METER DOWNLOAD SUMMARY: does not check  ? ? ? ?DIABETIC COMPLICATIONS: ?Microvascular complications:  ? ?Denies: CKD, retinopathy, neuropathy  ?Last Eye Exam: Completed 02/2021 ? ?Macrovascular complications:  ? ?Denies: CAD, CVA, PVD ? ? ?HISTORY:  ?Past Medical History:  ?Past Medical History:  ?Diagnosis Date  ? Allergy   ? Diabetes mellitus without complication ([place])   ? GERD (gastroesophageal reflux disease)   ? Hyperlipidemia   ? Thyroid disease   ? ?Past Surgical History:  ?Past Surgical History:  ?Procedure Laterality Date  ? FOOT SURGERY    ? REDUCTION  MAMMAPLASTY    ? SHOULDER SURGERY    ? ?Social History:  reports that she has quit smoking. She has never used smokeless tobacco. She reports that she does not drink alcohol and does not use drugs. ?Family History:  ?Family History  ?Problem Relation Age of Onset  ? Colon cancer Neg Hx   ? Colon polyps Neg Hx   ? Esophageal cancer Neg Hx   ? Rectal cancer Neg Hx   ? Stomach cancer Neg Hx   ? ? ? ?HOME MEDICATIONS: ?Allergies as of 06/19/2021   ?No Known Allergies ?  ? ?  ?Medication List  ?  ? ?  ? Accurate as of Jun 19, 2021  6:43 AM. If you have any questions, ask your nurse or doctor.  ?  ?  ? ?  ? ?blood glucose meter kit and supplies ?Dispense based on patient and insurance preference. Use up to four times daily as directed. (FOR ICD-10 E10.9, E11.9). ?  ?levothyroxine 137 MCG tablet ?Commonly known as: SYNTHROID ?TAKE 1 TABLET(137 MCG) BY MOUTH DAILY BEFORE AND BREAKFAST ?  ?metFORMIN 500 MG 24 hr tablet ?Commonly known as: GLUCOPHAGE-XR ?Take 2 tablets (1,000 mg total) by mouth daily with breakfast. ?  ?Marketing executive w/Device Kit ?Use as instructed to check blood sugar up 2 times daily ?  ?OneTouch Verio test strip ?Generic drug: glucose blood ?Use as instructed to check blood sugar up 2 times daily ?  ?rosuvastatin 10 MG tablet ?Commonly known as: Crestor ?Take 1 tablet (10 mg total)  by mouth daily. ?  ?traZODone 50 MG tablet ?Commonly known as: DESYREL ?Take 0.5-1 tablets (25-50 mg total) by mouth at bedtime as needed for sleep. ?  ? ?  ? ? ? ?OBJECTIVE:  ? ?Vital Signs: There were no vitals taken for this visit.  ?Wt Readings from Last 3 Encounters:  ?11/29/20 205 lb (93 kg)  ?07/27/20 202 lb (91.6 kg)  ?07/27/20 202 lb (91.6 kg)  ? ? ? ?Exam: ?General: Pt appears well and is in NAD  ?Neck: General: Supple without adenopathy. ?Thyroid: Thyroid size normal.  No goiter or nodules appreciated.  ?Lungs: Clear with good BS bilat with no rales, rhonchi, or wheezes  ?Heart: RRR with normal S1 and S2 and  no gallops; no murmurs; no rub  ?Abdomen: Normoactive bowel sounds, soft, nontender, without masses or organomegaly palpable  ?Extremities: No pretibial edema.  ?Neuro: MS is good with appropriate affect, pt is alert and Ox3  ? ? ?DM foot exam: 06/19/2021 ? ?The skin of the feet is intact without sores or ulcerations. ?The pedal pulses are 2+ on right and 2+ on left. ?The sensation is intact to a screening 5.07, 10 gram monofilament bilaterally ? ? ? ? ?DATA REVIEWED: ? ?Lab Results  ?Component Value Date  ? HGBA1C 6.7 (A) 11/29/2020  ? HGBA1C 6.8 (H) 07/27/2020  ? HGBA1C 6.5 (H) 03/22/2020  ? ? Latest Reference Range & Units 06/19/21 07:37  ?Sodium 135 - 145 mEq/L 139  ?Potassium 3.5 - 5.1 mEq/L 4.1  ?Chloride 96 - 112 mEq/L 104  ?CO2 19 - 32 mEq/L 28  ?Glucose 70 - 99 mg/dL 119 (H)  ?BUN 6 - 23 mg/dL 14  ?Creatinine 0.40 - 1.20 mg/dL 0.90  ?Calcium 8.4 - 10.5 mg/dL 9.3  ?GFR >60.00 mL/min 68.70  ?Total CHOL/HDL Ratio  3  ?Cholesterol 0 - 200 mg/dL 149  ?HDL Cholesterol >39.00 mg/dL 42.60  ?LDL (calc) 0 - 99 mg/dL 86  ?MICROALB/CREAT RATIO 0.0 - 30.0 mg/g 2.4  ?NonHDL  106.45  ?Triglycerides 0.0 - 149.0 mg/dL 104.0  ?VLDL 0.0 - 40.0 mg/dL 20.8  ? ? Latest Reference Range & Units 06/19/21 07:37  ?TSH 0.35 - 5.50 uIU/mL 5.26  ?T4,Free(Direct) 0.60 - 1.60 ng/dL 1.21  ? ? Latest Reference Range & Units 06/19/21 07:37  ?Creatinine,U mg/dL 185.1  ?Microalb, Ur 0.0 - 1.9 mg/dL 4.5 (H)  ?MICROALB/CREAT RATIO 0.0 - 30.0 mg/g 2.4  ? ? ? ?ASSESSMENT / PLAN / RECOMMENDATIONS:  ? ?1) Type 2 Diabetes Mellitus, optimally controlled, With out complications - Most recent A1c of 6.7 %. Goal A1c <7.0%. ? ?- Praised the pt on improved glycemic control  ?- NO changes  ? ? ?MEDICATIONS: ?Metformin 500 mg XR 2 tabs daily ? ?EDUCATION / INSTRUCTIONS: ?BG monitoring instructions: Patient is instructed to check her blood sugars 1 times a day, fasting. ? ? ?2) Diabetic complications:  ?Eye: Does not have known diabetic retinopathy.  ?Neuro/  Feet: Does not have known diabetic peripheral neuropathy .  ?Renal: Patient does not have known baseline CKD. She   is not on an ACEI/ARB at present. ? ? ? ?3) Hashimoto's thyroiditis: ? ?- She has occasional fatigue  ?-TSH is elevated, will increase levothyroxine as below ? ?Medication ?Stop levothyroxine 137 mcg  ?Start levothyroxine 150 mcg daily ? ?F/U in 6 months  ? ?Signed electronically by: ?Abby Nena Jordan, MD ? ?[facility]  ?[facility] ?[address] ?[place], [place] [address] ?Phone: [phone] ?FAX:  [phone] ? ? ?CC: ?Just, Laurita Quint, FNP (Inactive) ?No address on file ?Phone: None  ?Fax: None ? ?Return to Endocrinology clinic as below: ?Future Appointments  ?Date Time Provider [place]  ?06/19/2021  7:50 AM Taimi Towe, Melanie Crazier, MD [facility] None  ?06/27/2021  8:50 AM [facility] 3 [facility]  ?07/12/2021 10:40 AM Henson, Vickie L, NP-C [facility] None  ?  ? ? ?

## 2021-06-20 MED ORDER — LEVOTHYROXINE SODIUM 150 MCG PO TABS: 150.0000 ug | ORAL_TABLET | Freq: Every day | ORAL | 3 refills | Status: DC

## 2021-06-20 NOTE — Telephone Encounter (Signed)
Patient notified of results.

## 2021-06-27 ENCOUNTER — Ambulatory Visit: Payer: Self-pay

## 2021-06-27 ENCOUNTER — Ambulatory Visit
Admission: RE | Admit: 2021-06-27 | Discharge: 2021-06-27 | Disposition: A | Discharge status: Home / Self Care | Payer: 59 | Source: Ambulatory Visit | Attending: Emergency Medicine | Admitting: Emergency Medicine

## 2021-06-27 DIAGNOSIS — Z1231 Encounter for screening mammogram for malignant neoplasm of breast: Secondary | ICD-10-CM

## 2021-07-12 ENCOUNTER — Ambulatory Visit: Payer: 59 | Admitting: Family Medicine

## 2021-07-12 ENCOUNTER — Encounter: Payer: Self-pay | Admitting: Family Medicine

## 2021-07-12 VITALS — BP 130/80 | HR 65 | Temp 97.6°F | Ht 64.0 in | Wt 204.0 lb

## 2021-07-12 DIAGNOSIS — E119 Type 2 diabetes mellitus without complications: Secondary | ICD-10-CM | POA: Diagnosis not present

## 2021-07-12 DIAGNOSIS — K219 Gastro-esophageal reflux disease without esophagitis: Secondary | ICD-10-CM | POA: Diagnosis not present

## 2021-07-12 DIAGNOSIS — R195 Other fecal abnormalities: Secondary | ICD-10-CM

## 2021-07-12 DIAGNOSIS — E039 Hypothyroidism, unspecified: Secondary | ICD-10-CM

## 2021-07-12 DIAGNOSIS — E782 Mixed hyperlipidemia: Secondary | ICD-10-CM

## 2021-07-12 MED ORDER — ROSUVASTATIN CALCIUM 10 MG PO TABS: 10.0000 mg | ORAL_TABLET | Freq: Every day | ORAL | 1 refills | Status: DC

## 2021-07-12 MED ORDER — PANTOPRAZOLE SODIUM 40 MG PO TBEC: 40.0000 mg | DELAYED_RELEASE_TABLET | Freq: Every day | ORAL | 1 refills | Status: DC

## 2021-07-12 NOTE — Patient Instructions (Addendum)
Start taking a stool softener (buy this over the counter).  Make sure you are drinking plenty of water.    Opciones de alimentos para pacientes adultos con enfermedad de reflujo gastroesofgico Food Choices for Gastroesophageal Reflux Disease, Adult Si tiene enfermedad de reflujo gastroesofgico (ERGE), los alimentos que consume y los hbitos de alimentacin son muy importantes. Elegir los alimentos adecuados puede ayudar a [employer]. Piense en consultar a un experto en alimentacin (nutricionista) para que lo ayude a Warehouse manager. Consejos para seguir Photographer las etiquetas de los alimentos Elija alimentos que tengan bajo contenido de grasas saturadas. Los alimentos que pueden ayudar con los sntomas incluyen los siguientes: Alimentos que tienen menos del 5 % de los valores diarios (VD) de grasa. Alimentos que tienen 0 gramos de grasas trans. Al cocinar No frer los alimentos. Cocinar la comida al horno, al vapor, a la plancha o en la parrilla. Estos son mtodos que no necesitan mucha grasa para Audiological scientist. Para agregar sabor, trate de consumir hierbas con bajo contenido de picante y [place]. Planificacin de las comidas  Elegir alimentos saludables con bajo contenido de [employer], por ejemplo: Frutas y vegetales. Cereales integrales. Productos lcteos con bajo contenido de [place]. Lysbeth Galas, pescado y aves. Haga comidas pequeas frecuentes en lugar de 3 comidas abundantes al da. Coma lentamente, en un lugar donde est relajado. Evite agacharse o recostarse hasta 2 o 3 horas despus de haber comido. Limite los alimentos con alto contenido graso como las carnes grasas o los alimentos fritos. Limite su ingesta de alimentos grasos, como aceites, [place] y [place]. Evite lo siguiente si el mdico se lo indica: Consumir alimentos que le ocasionen sntomas. Pueden ser distintos para cada persona. Lleve un registro de los alimentos para identificar aquellos que le causen  sntomas. Alcohol. Beber mucha cantidad de lquido con las comidas. Comer 2 o 3 horas antes de acostarse. Estilo de vida Mantenga un peso saludable. Pregntele a su mdico cul es el peso saludable para usted. Si necesita perder peso, hable con su mdico para hacerlo de manera segura. Haga actividad fsica durante, al menos, 30 minutos 5 das por semana o ms, o como se lo haya indicado el mdico. Use ropa suelta. No fume ni consuma ningn producto que contenga nicotina o tabaco. Si necesita ayuda para dejar de fumar, consulte al mdico. Duerma con la cabecera de la cama ms elevada que los pies. Use una cua debajo del colchn o bloques debajo del armazn de la cama para Theatre manager la cabecera de la cama elevada. Mastique chicle sin azcar despus de las comidas. Qu alimentos debo comer?  Siga una dieta saludable y bien equilibrada que incluya abundantes frutas, verduras, cereales integrales, productos lcteos descremados, carnes magras, pescado y aves. Cada persona es diferente. Los alimentos que pueden causar sntomas en una persona pueden no causar sntomas en otra persona. Trabaje con el mdico para hallar alimentos que sean seguros para usted. Es posible que los productos que se enumeran ms [place] no constituyan una lista completa de lo que puede comer y Electronics engineer. Pngase en contacto con un experto en alimentos para conocer ms opciones. Qu alimentos debo evitar? Limitar algunos de estos alimentos puede ayudar a Chief Technology Officer los sntomas de [place]. Cada persona es diferente. Consulte a un experto en alimentacin o a su mdico para que lo ayude a Pension scheme manager los alimentos exactos que debe evitar, si los hubiere. Frutas Cualquier fruta que est preparada con grasa agregada. Cualquier fruta que le ocasione sntomas. Para  algunas personas, estas pueden incluir, las frutas ctricas como naranja, pomelo, pia y limn. Verduras Verduras fritas en abundante aceite. Papas fritas. Cualquier verdura que  est preparada con grasa agregada. Cualquier verdura que le ocasione sntomas. Para algunas personas, estas pueden incluir tomates y productos con tomate, [place], cebollas y [place], y rbanos picantes. Granos Pasteles o panes sin levadura con grasa agregada. Carnes y otras protenas Carnes de alto contenido graso como carne grasa de vaca o cerdo, salchichas, costillas, jamn, salchicha, salame y tocino. Carnes o protenas fritas, lo que incluye pescado frito y pollo frito. Frutos secos y Engineer, mining de frutos secos, en grandes cantidades. Lcteos Leche entera y [place] con chocolate. Phoebe Sharps. Crema. Helado. Queso crema. Batidos con [employer]. Grasas y [employer]. Margarina. Lardo. [place] clarificada. Bebidas Caf y t negro, con o sin cafena. Bebidas con gas. Refrescos. Bebidas energizantes. Jugo de fruta hecho con frutas cidas, como naranja o pomelo. Jugo de tomate. Bebidas alcohlicas. Dulces y postres Chocolate y cacao. Rosquillas. Alios y condimentos Pimienta. Menta y mentol. Sal agregada. Cualquier condimento, hierbas o aderezos que le ocasionen sntomas. Para algunas personas, esto puede incluir curry, salsa picante o aderezos para ensalada a base de vinagre. Es posible que los productos que se enumeran ms arriba no constituyan una lista completa de lo que no debera comer y Electronics engineer. Pngase en contacto con un experto en alimentos para conocer ms opciones. Preguntas para hacerle al [employer] Los cambios en la dieta y en el estilo de vida a menudo son los primeros pasos que se toman para Air cabin crew los sntomas de [place]. Si los [employer] dieta y el estilo de vida no ayudan, hable con el mdico sobre el uso de medicamentos. Dnde buscar ms informacin Control and instrumentation engineer for Gastrointestinal Disorders (Fundacin Internacional para los Trastornos Gastrointestinales): aboutgerd.org Resumen Si tiene [employer], las elecciones de alimentos y el [place] de vida son muy importantes para  ayudar a Herbalist sntomas. Haga comidas pequeas durante Psychiatrist de 3 comidas abundantes. Coma lentamente y en un lugar donde est relajado. Evite agacharse o recostarse hasta 2 o 3 horas despus de haber comido. Limite los alimentos con alto contenido graso como las carnes grasas o los alimentos fritos. Esta informacin no tiene Marine scientist el consejo del mdico. Asegrese de hacerle al mdico cualquier pregunta que tenga. Document Revised: 09/10/2019 Document Reviewed: 09/10/2019 Elsevier Patient Education  [place].

## 2021-07-12 NOTE — Assessment & Plan Note (Signed)
Recommend good hydration which she has not been doing. Increase fiber in diet. May add stool softeners or fiber supplements OTC if needed. Follow up in 4 weeks.

## 2021-07-12 NOTE — Assessment & Plan Note (Signed)
Refilled statin per patient request. Recent labs done by endocrinologist.

## 2021-07-12 NOTE — Assessment & Plan Note (Signed)
Recommend taking PPI daily for the next 4 weeks. Avoid food triggers. Eat small frequent meals and avoid eating a laying down.  Follow up in 4 weeks or sooner if needed.  Consider referral back to GI. Unclear as to whether she was treated for H Pylori in the past.

## 2021-07-12 NOTE — Progress Notes (Signed)
 New Patient Office Visit  Subjective    Patient ID: Sheri West, female    DOB: 12/03/1960  Age: 61 y.o. MRN: [id number]  CC:  Chief Complaint  Patient presents with   Establish Care    No concerns    HPI Sheri West presents to establish care.  Declines interpreter on a stick.   She is under the care of endocrinology for thyroid disorder and diabetes.   She is taking Nexium some days as needed for acid reflux.   States she eats only once per day due to stomach bloating.   Has daily bowel movements but has hard stools   Hx of H. Pylori ? Treated    Denies fever, chills, dizziness, chest pain, palpitations, shortness of breath, N/V/D, LE edema.     She is married. Has been taking care of her husband who is ill.    Outpatient Encounter Medications as of 07/12/2021  Medication Sig   blood glucose meter kit and supplies Dispense based on patient and insurance preference. Use up to four times daily as directed. (FOR ICD-10 E10.9, E11.9).   Blood Glucose Monitoring Suppl (ONETOUCH VERIO FLEX SYSTEM) w/Device KIT Use as instructed to check blood sugar up 2 times daily   glucose blood (ONETOUCH VERIO) test strip Use as instructed to check blood sugar up 2 times daily   levothyroxine (SYNTHROID) 150 MCG tablet Take 1 tablet (150 mcg total) by mouth daily.   metFORMIN (GLUCOPHAGE-XR) 500 MG 24 hr tablet Take 2 tablets (1,000 mg total) by mouth daily with breakfast.   pantoprazole (PROTONIX) 40 MG tablet Take 1 tablet (40 mg total) by mouth daily.   traZODone (DESYREL) 50 MG tablet Take 0.5-1 tablets (25-50 mg total) by mouth at bedtime as needed for sleep.   [DISCONTINUED] rosuvastatin (CRESTOR) 10 MG tablet Take 1 tablet (10 mg total) by mouth daily.   rosuvastatin (CRESTOR) 10 MG tablet Take 1 tablet (10 mg total) by mouth daily.   No facility-administered encounter medications on file as of 07/12/2021.    Past Medical History:  Diagnosis Date   Allergy     Diabetes mellitus without complication (HCC)    GERD (gastroesophageal reflux disease)    Hyperlipidemia    Thyroid disease     Past Surgical History:  Procedure Laterality Date   FOOT SURGERY     REDUCTION MAMMAPLASTY     SHOULDER SURGERY      Family History  Problem Relation Age of Onset   Colon cancer Neg Hx    Colon polyps Neg Hx    Esophageal cancer Neg Hx    Rectal cancer Neg Hx    Stomach cancer Neg Hx    Breast cancer Neg Hx     Social History   Socioeconomic History   Marital status: Married    Spouse name: Not on file   Number of children: Not on file   Years of education: Not on file   Highest education level: Not on file  Occupational History   Not on file  Tobacco Use   Smoking status: Former   Smokeless tobacco: Never   Tobacco comments:    quit 20 yrs ago   Vaping Use   Vaping Use: Never used  Substance and Sexual Activity   Alcohol use: No    Alcohol/week: 0.0 standard drinks   Drug use: No   Sexual activity: Not on file  Other Topics Concern   Not on file  Social History Narrative     Not on file   Social Determinants of Health   Financial Resource Strain: Not on file  Food Insecurity: Not on file  Transportation Needs: Not on file  Physical Activity: Not on file  Stress: Not on file  Social Connections: Not on file  Intimate Partner Violence: Not on file    ROS      Objective    BP 130/80 (BP Location: Left Arm, Patient Position: Sitting, Cuff Size: Large)   Pulse 65   Temp 97.6 F (36.4 C) (Temporal)   Ht 5' 4" (1.626 m)   Wt 204 lb (92.5 kg)   SpO2 99%   BMI 35.02 kg/m   Physical Exam Constitutional:      General: She is not in acute distress.    Appearance: She is not ill-appearing.  Eyes:     Conjunctiva/sclera: Conjunctivae normal.     Pupils: Pupils are equal, round, and reactive to light.  Cardiovascular:     Rate and Rhythm: Normal rate and regular rhythm.  Pulmonary:     Effort: Pulmonary effort is  normal.     Breath sounds: Normal breath sounds.  Abdominal:     General: Bowel sounds are normal. There is no distension.     Palpations: Abdomen is soft.     Tenderness: There is no abdominal tenderness. There is no right CVA tenderness, left CVA tenderness, guarding or rebound.  Musculoskeletal:        General: Normal range of motion.  Skin:    General: Skin is warm and dry.  Neurological:     General: No focal deficit present.     Mental Status: She is alert and oriented to person, place, and time.     Cranial Nerves: No cranial nerve deficit.     Motor: No weakness.  Psychiatric:        Mood and Affect: Mood normal.        Thought Content: Thought content normal.        Assessment & Plan:   Problem List Items Addressed This Visit       Digestive   Gastroesophageal reflux disease - Primary    Recommend taking PPI daily for the next 4 weeks. Avoid food triggers. Eat small frequent meals and avoid eating a laying down.  Follow up in 4 weeks or sooner if needed.  Consider referral back to GI. Unclear as to whether she was treated for H Pylori in the past.        Relevant Medications   pantoprazole (PROTONIX) 40 MG tablet     Endocrine   Hypothyroidism   Type 2 diabetes mellitus without complication, without long-term current use of insulin (HCC)   Relevant Medications   rosuvastatin (CRESTOR) 10 MG tablet     Other   Hard stool    Recommend good hydration which she has not been doing. Increase fiber in diet. May add stool softeners or fiber supplements OTC if needed. Follow up in 4 weeks.        Mixed hyperlipidemia    Refilled statin per patient request. Recent labs done by endocrinologist.        Relevant Medications   rosuvastatin (CRESTOR) 10 MG tablet    Return in about 4 weeks (around 08/09/2021).   [name], NP-C

## 2021-08-18 ENCOUNTER — Other Ambulatory Visit: Payer: Self-pay | Admitting: Family Medicine

## 2021-08-18 DIAGNOSIS — K219 Gastro-esophageal reflux disease without esophagitis: Secondary | ICD-10-CM

## 2021-08-22 ENCOUNTER — Ambulatory Visit: Payer: 59 | Admitting: Family Medicine

## 2021-08-22 ENCOUNTER — Encounter: Payer: Self-pay | Admitting: Family Medicine

## 2021-08-22 VITALS — BP 124/74 | HR 60 | Temp 97.6°F | Ht 64.0 in | Wt 202.0 lb

## 2021-08-22 DIAGNOSIS — K219 Gastro-esophageal reflux disease without esophagitis: Secondary | ICD-10-CM

## 2021-08-22 DIAGNOSIS — R195 Other fecal abnormalities: Secondary | ICD-10-CM | POA: Diagnosis not present

## 2021-08-22 DIAGNOSIS — Z8619 Personal history of other infectious and parasitic diseases: Secondary | ICD-10-CM | POA: Diagnosis not present

## 2021-08-22 NOTE — Progress Notes (Signed)
Subjective:     Patient ID: Sheri West, female    DOB: Mar 28, 1960, 61 y.o.   MRN: [id number]  Chief Complaint  Patient presents with   Follow-up    F/u on GERD, states she has been feeling a lot better. Needs refill on protonix    HPI Patient is in today for follow up on GERD and constipation. Started on pantoprazole 4 weeks ago and her symptoms have improved but she continues having symptoms.  States she is unable to enjoy her usual foods without having acid reflux symptoms.  Treated for H. Pylori in 2019 had a different practice.  States her stools have been less hard since getting more fiber in her diet.  No fever, chills, headache, dizziness, chest pain, palpitations, shortness of breath, abdominal pain, vomiting or diarrhea.  Health Maintenance Due  Topic Date Due   OPHTHALMOLOGY EXAM  Never done   HIV Screening  Never done   Hepatitis C Screening  Never done   TETANUS/TDAP  Never done   Zoster Vaccines- Shingrix (1 of 2) Never done    Past Medical History:  Diagnosis Date   Allergy    Diabetes mellitus without complication (HCC)    GERD (gastroesophageal reflux disease)    Hyperlipidemia    Thyroid disease     Past Surgical History:  Procedure Laterality Date   FOOT SURGERY     REDUCTION MAMMAPLASTY     SHOULDER SURGERY      Family History  Problem Relation Age of Onset   Colon cancer Neg Hx    Colon polyps Neg Hx    Esophageal cancer Neg Hx    Rectal cancer Neg Hx    Stomach cancer Neg Hx    Breast cancer Neg Hx     Social History   Socioeconomic History   Marital status: Married    Spouse name: Not on file   Number of children: Not on file   Years of education: Not on file   Highest education level: Not on file  Occupational History   Not on file  Tobacco Use   Smoking status: Former   Smokeless tobacco: Never   Tobacco comments:    quit 20 yrs ago   Vaping Use   Vaping Use: Never used  Substance and Sexual Activity   Alcohol  use: No    Alcohol/week: 0.0 standard drinks of alcohol   Drug use: No   Sexual activity: Not on file  Other Topics Concern   Not on file  Social History Narrative   Not on file   Social Determinants of Health   Financial Resource Strain: Not on file  Food Insecurity: Not on file  Transportation Needs: Not on file  Physical Activity: Not on file  Stress: Not on file  Social Connections: Not on file  Intimate Partner Violence: Not on file    Outpatient Medications Prior to Visit  Medication Sig Dispense Refill   blood glucose meter kit and supplies Dispense based on patient and insurance preference. Use up to four times daily as directed. (FOR ICD-10 E10.9, E11.9). 1 each 4   Blood Glucose Monitoring Suppl (ONETOUCH VERIO FLEX SYSTEM) w/Device KIT Use as instructed to check blood sugar up 2 times daily 1 kit 0   glucose blood (ONETOUCH VERIO) test strip Use as instructed to check blood sugar up 2 times daily 100 each 5   levothyroxine (SYNTHROID) 150 MCG tablet Take 1 tablet (150 mcg total) by mouth daily. 90 tablet  3   metFORMIN (GLUCOPHAGE-XR) 500 MG 24 hr tablet Take 2 tablets (1,000 mg total) by mouth daily with breakfast. 180 tablet 3   pantoprazole (PROTONIX) 40 MG tablet Take 1 tablet (40 mg total) by mouth daily. 30 tablet 1   rosuvastatin (CRESTOR) 10 MG tablet Take 1 tablet (10 mg total) by mouth daily. 90 tablet 1   traZODone (DESYREL) 50 MG tablet Take 0.5-1 tablets (25-50 mg total) by mouth at bedtime as needed for sleep. 30 tablet 3   No facility-administered medications prior to visit.    No Known Allergies  ROS     Objective:    Physical Exam  BP 124/74 (BP Location: Left Arm, Patient Position: Sitting, Cuff Size: Large)   Pulse 60   Temp 97.6 F (36.4 C) (Temporal)   Ht $R'5\' 4"'DY$  (1.626 m)   Wt 202 lb (91.6 kg)   SpO2 97%   BMI 34.67 kg/m  Wt Readings from Last 3 Encounters:  08/22/21 202 lb (91.6 kg)  07/12/21 204 lb (92.5 kg)  06/19/21 204 lb 12.8  oz (92.9 kg)   Alert and oriented and in no acute distress.  Respirations unlabored.  Normal mood.    Assessment & Plan:   Problem List Items Addressed This Visit       Digestive   Gastroesophageal reflux disease - Primary    Some improvement with PPI daily for the past 4 weeks.  Continues having symptoms and would like referral to GI.  Referral made. Continue pantoprazole daily or as needed and trigger avoidance.  Reiterated lifestyle modifications to control GERD.      Relevant Orders   Ambulatory referral to Gastroenterology     Other   Hard stool    Improving with increased fiber and hydration      Relevant Orders   Ambulatory referral to Gastroenterology   History of Helicobacter pylori infection    Review of the chart shows that she had a positive H. pylori breath test in 2019 and was treated.  She does not recall this.      Relevant Orders   Ambulatory referral to Gastroenterology    I am having Yiselle Marlowe maintain her traZODone, blood glucose meter kit and supplies, OneTouch Verio, [employer] Flex System, metFORMIN, levothyroxine, pantoprazole, and rosuvastatin.  No orders of the defined types were placed in this encounter.

## 2021-08-22 NOTE — Assessment & Plan Note (Signed)
Review of the chart shows that she had a positive H. pylori breath test in 2019 and was treated.  She does not recall this.

## 2021-08-22 NOTE — Patient Instructions (Addendum)
Continue taking pantoprazole daily or as needed since it is helping with your acid reflux.  Avoid foods and beverages that trigger acid reflux and avoid eating and laying down.  Continue taking fiber or getting plenty of fiber in your diet and staying well-hydrated.  I referred you back to your gastroenterologist and they should call you to schedule an appointment.  If you have not heard from them in 2 weeks, let us know.  I recommend coming back in October for a wellness/preventive health visit.

## 2021-08-22 NOTE — Assessment & Plan Note (Signed)
Improving with increased fiber and hydration

## 2021-08-22 NOTE — Assessment & Plan Note (Signed)
Some improvement with PPI daily for the past 4 weeks.  Continues having symptoms and would like referral to GI.  Referral made. Continue pantoprazole daily or as needed and trigger avoidance.  Reiterated lifestyle modifications to control GERD.

## 2021-09-18 ENCOUNTER — Other Ambulatory Visit: Payer: Self-pay | Admitting: Family Medicine

## 2021-09-18 DIAGNOSIS — K219 Gastro-esophageal reflux disease without esophagitis: Secondary | ICD-10-CM

## 2021-11-28 ENCOUNTER — Other Ambulatory Visit: Payer: Self-pay | Admitting: Family Medicine

## 2021-11-28 DIAGNOSIS — E782 Mixed hyperlipidemia: Secondary | ICD-10-CM

## 2021-12-17 NOTE — Progress Notes (Unsigned)
Name: Sheri West  Age/ Sex: 61 y.o., female   MRN/ DOB: [id number], [birth year]     PCP: Sheri Rm, NP-C   Reason for Endocrinology Evaluation: Type 2 Diabetes Mellitus/ Hypothyroidism  Initial Endocrine Consultative Visit: 11/29/2020    PATIENT IDENTIFIER: Sheri West is a 61 y.o. female with a past medical history of T2DM, Hashimoto's thyroiditis and dyslipidemia. The patient has followed with Endocrinology clinic since 11/29/2020 for consultative assistance with management of her diabetes.  DIABETIC HISTORY:  Sheri West was diagnosed with DM in 2021, she was started on metformin at the time. Her hemoglobin A1c has ranged from 6.5% in in 03/2020, peaking at 6.8% in 07/2020.    THYROID HISTORY: She has been diagnosed with hypothyroidism decades ago.  She has no prior history of neck surgery no radiation And no family history of thyroid disease  Anti-TPO antibodies were elevated> 900 IU/mL  SUBJECTIVE:     Today (12/18/2021): Sheri West is here for follow-up on diabetes and Hashimoto's thyroiditis.  She checks her blood sugars 0 times daily    Denies nausea or diarrhea ,but  has constipation  Denies tingling or numbness of the feet but has tingling but has tingling of the hands  Has noted occasional neck swelling as well as LE edema   HOME ENDOCRINE REGIMEN:  Levothyroxine 150 mcg daily Metformin 500 mg 2 tabs daily    Statin: yes ACE-I/ARB: no   METER DOWNLOAD SUMMARY: does not check     DIABETIC COMPLICATIONS: Microvascular complications:   Denies: CKD, retinopathy, neuropathy  Last Eye Exam: Completed 02/2021  Macrovascular complications:   Denies: CAD, CVA, PVD   HISTORY:  Past Medical History:  Past Medical History:  Diagnosis Date   Allergy    Diabetes mellitus without complication (HCC)    GERD (gastroesophageal reflux disease)    Hyperlipidemia    Thyroid disease    Past Surgical History:  Past Surgical History:   Procedure Laterality Date   FOOT SURGERY     REDUCTION MAMMAPLASTY     SHOULDER SURGERY     Social History:  reports that she has quit smoking. She has never used smokeless tobacco. She reports that she does not drink alcohol and does not use drugs. Family History:  Family History  Problem Relation Age of Onset   Colon cancer Neg Hx    Colon polyps Neg Hx    Esophageal cancer Neg Hx    Rectal cancer Neg Hx    Stomach cancer Neg Hx    Breast cancer Neg Hx      HOME MEDICATIONS: Allergies as of 12/18/2021   No Known Allergies      Medication List        Accurate as of December 18, 2021  7:58 AM. If you have any questions, ask your nurse or doctor.          blood glucose meter kit and supplies Dispense based on patient and insurance preference. Use up to four times daily as directed. (FOR ICD-10 E10.9, E11.9).   levothyroxine 150 MCG tablet Commonly known as: SYNTHROID Take 1 tablet (150 mcg total) by mouth daily.   metFORMIN 500 MG 24 hr tablet Commonly known as: GLUCOPHAGE-XR Take 2 tablets (1,000 mg total) by mouth daily with breakfast.   OneTouch Verio Flex System w/Device Kit Use as instructed to check blood sugar up 2 times daily   OneTouch Verio test strip Generic drug: glucose blood Use as instructed to check blood sugar up  2 times daily   pantoprazole 40 MG tablet Commonly known as: PROTONIX TAKE 1 TABLET(40 MG) BY MOUTH DAILY   rosuvastatin 10 MG tablet Commonly known as: Crestor Take 1 tablet (10 mg total) by mouth daily.   traZODone 50 MG tablet Commonly known as: DESYREL Take 0.5-1 tablets (25-50 mg total) by mouth at bedtime as needed for sleep.         OBJECTIVE:   Vital Signs: BP 114/68 (BP Location: Left Arm, Patient Position: Sitting, Cuff Size: Large)   Pulse 67   Ht _0  (1.626 m)   Wt 202 lb (91.6 kg)   SpO2 97%   BMI 34.67 kg/m   Wt Readings from Last 3 Encounters:  12/18/21 202 lb (91.6 kg)  08/22/21 202 lb (91.6 kg)   07/12/21 204 lb (92.5 kg)     Exam: General: Pt appears well and is in NAD  Neck: General: Supple without adenopathy. Thyroid: Thyroid size normal.  No goiter or nodules appreciated.  Lungs: Clear with good BS bilat with no rales, rhonchi, or wheezes  Heart: RRR   Abdomen: Normoactive bowel sounds, soft, nontender, without masses or organomegaly palpable  Extremities: No pretibial edema.  Neuro: MS is good with appropriate affect, pt is alert and Ox3    DM foot exam: 06/19/2021  The skin of the feet is intact without sores or ulcerations. The pedal pulses are 2+ on right and 2+ on left. The sensation is intact to a screening 5.07, 10 gram monofilament bilaterally     DATA REVIEWED:  Lab Results  Component Value Date   HGBA1C 6.4 (A) 12/18/2021   HGBA1C 6.7 (A) 06/19/2021   HGBA1C 6.7 (A) 11/29/2020    Latest Reference Range & Units 12/18/21 08:10  TSH 0.35 - 5.50 uIU/mL 0.19 (L)      Latest Reference Range & Units 06/19/21 07:37  Sodium 135 - 145 mEq/L 139  Potassium 3.5 - 5.1 mEq/L 4.1  Chloride 96 - 112 mEq/L 104  CO2 19 - 32 mEq/L 28  Glucose 70 - 99 mg/dL 119 (H)  BUN 6 - 23 mg/dL 14  Creatinine 0.40 - 1.20 mg/dL 0.90  Calcium 8.4 - 10.5 mg/dL 9.3  GFR >60.00 mL/min 68.70  Total CHOL/HDL Ratio  3  Cholesterol 0 - 200 mg/dL 149  HDL Cholesterol >39.00 mg/dL 42.60  LDL (calc) 0 - 99 mg/dL 86  MICROALB/CREAT RATIO 0.0 - 30.0 mg/g 2.4  NonHDL  106.45  Triglycerides 0.0 - 149.0 mg/dL 104.0  VLDL 0.0 - 40.0 mg/dL 20.8     Latest Reference Range & Units 06/19/21 07:37  Creatinine,U mg/dL 185.1  Microalb, Ur 0.0 - 1.9 mg/dL 4.5 (H)  MICROALB/CREAT RATIO 0.0 - 30.0 mg/g 2.4     ASSESSMENT / PLAN / RECOMMENDATIONS:   1) Type 2 Diabetes Mellitus, optimally controlled, With out complications - Most recent A1c of 6.4 %. Goal A1c <7.0%.  - Praised the pt on improved glycemic control  - NO changes    MEDICATIONS: Metformin 500 mg XR 2 tabs  daily  EDUCATION / INSTRUCTIONS: BG monitoring instructions: Patient is instructed to check her blood sugars 1 times a day, fasting.   2) Diabetic complications:  Eye: Does not have known diabetic retinopathy.  Neuro/ Feet: Does not have known diabetic peripheral neuropathy .  Renal: Patient does not have known baseline CKD. She   is not on an ACEI/ARB at present.    3) Hashimoto's thyroiditis:  - TSH is low on levothyroxine  150 mcg daily, interestingly enough her TSH was elevated >5 uIU/mL on levothyroxine 137 mcg daily, not sure if there was an imperfect adherence with levothyroxine in the past. -We will reduce levothyroxine as below     Medication Decrease levothyroxine 150 mcg , half a tablet on Sundays, and 1 tablet rest of the week  F/U in 6 months   Signed electronically by: Mack Guise, MD  [facility]  [facility] [address] [place], [place] [address] Phone: [phone] FAX: [phone]   CC: Sheri Rm, NP-C [place] [address] Phone: [phone]  Fax: [phone]  Return to Endocrinology clinic as below: Future Appointments  Date Time Provider [place]  12/18/2021  8:10 AM Luella West, Melanie Crazier, MD [facility] None

## 2021-12-18 ENCOUNTER — Encounter: Payer: Self-pay | Admitting: Internal Medicine

## 2021-12-18 ENCOUNTER — Ambulatory Visit: Payer: 59 | Admitting: Internal Medicine

## 2021-12-18 VITALS — BP 114/68 | HR 67 | Ht 64.0 in | Wt 202.0 lb

## 2021-12-18 DIAGNOSIS — E063 Autoimmune thyroiditis: Secondary | ICD-10-CM | POA: Diagnosis not present

## 2021-12-18 DIAGNOSIS — E119 Type 2 diabetes mellitus without complications: Secondary | ICD-10-CM | POA: Diagnosis not present

## 2021-12-18 LAB — TSH: TSH: 0.19 u[IU]/mL — ABNORMAL LOW (ref 0.35–5.50)

## 2021-12-18 LAB — POCT GLUCOSE (DEVICE FOR HOME USE): Glucose Fasting, POC: 120 mg/dL — AB (ref 70–99)

## 2021-12-18 LAB — POCT GLYCOSYLATED HEMOGLOBIN (HGB A1C): Hemoglobin A1C: 6.4 % — AB (ref 4.0–5.6)

## 2021-12-18 MED ORDER — METFORMIN HCL ER 500 MG PO TB24: 1000.0000 mg | ORAL_TABLET | Freq: Every day | ORAL | 3 refills | Status: AC

## 2021-12-18 NOTE — Patient Instructions (Signed)

## 2021-12-19 ENCOUNTER — Telehealth: Payer: Self-pay | Admitting: Internal Medicine

## 2021-12-19 MED ORDER — LEVOTHYROXINE SODIUM 150 MCG PO TABS: 150.0000 ug | ORAL_TABLET | ORAL | 3 refills | Status: AC

## 2021-12-19 NOTE — Telephone Encounter (Signed)
Left vm for patient to callback and also sent my chart message

## 2021-12-19 NOTE — Telephone Encounter (Signed)
Please let the patient know that the current dose of levothyroxine 150 mcg daily is too much for her    Please asked the patient from now on to take only HALF a tablet of levothyroxine on Sundays, but continue to take 1 tablet daily Monday through Saturday   Thanks

## 2021-12-21 NOTE — Telephone Encounter (Signed)
LMTCB

## 2022-01-09 ENCOUNTER — Other Ambulatory Visit: Payer: Self-pay | Admitting: Internal Medicine

## 2022-05-02 ENCOUNTER — Other Ambulatory Visit: Payer: Self-pay | Admitting: Family Medicine

## 2022-05-02 DIAGNOSIS — E782 Mixed hyperlipidemia: Secondary | ICD-10-CM

## 2022-05-04 LAB — HM DIABETES EYE EXAM

## 2022-06-02 ENCOUNTER — Other Ambulatory Visit: Payer: Self-pay | Admitting: Family Medicine

## 2022-06-02 DIAGNOSIS — E782 Mixed hyperlipidemia: Secondary | ICD-10-CM

## 2022-06-18 ENCOUNTER — Ambulatory Visit: Payer: 59 | Admitting: Internal Medicine

## 2022-06-18 NOTE — Progress Notes (Deleted)
Name: Sheri West  Age/ Sex: 62 y.o., female   MRN/ DOB: [id number], 1960/11/22     PCP: Avanell Shackleton, NP-C   Reason for Endocrinology Evaluation: Type 2 Diabetes Mellitus/ Hypothyroidism  Initial Endocrine Consultative Visit: 11/29/2020    PATIENT IDENTIFIER: [name] is a 62 y.o. female with a past medical history of T2DM, Hashimoto's thyroiditis and dyslipidemia. The patient has followed with Endocrinology clinic since 11/29/2020 for consultative assistance with management of her diabetes.  DIABETIC HISTORY:  Sheri West was diagnosed with DM in 2021, she was started on metformin at the time. Her hemoglobin A1c has ranged from 6.5% in in 03/2020, peaking at 6.8% in 07/2020.    THYROID HISTORY: She has been diagnosed with hypothyroidism decades ago.  She has no prior history of neck surgery no radiation And no family history of thyroid disease  Anti-TPO antibodies were elevated> 900 IU/mL  SUBJECTIVE:     Today (06/18/2022): Sheri West is here for follow-up on diabetes and Hashimoto's thyroiditis.  She checks her blood sugars 0 times daily   Denies nausea or diarrhea ,but  has constipation  Denies tingling or numbness of the feet but has tingling but has tingling of the hands  Has noted occasional neck swelling as well as LE edema     HOME ENDOCRINE REGIMEN:  Levothyroxine 150 mcg, half a tablet on Sundays and 1 tablet rest of the week Metformin 500 mg 2 tabs daily    Statin: yes ACE-I/ARB: no   METER DOWNLOAD SUMMARY: does not check     DIABETIC COMPLICATIONS: Microvascular complications:   Denies: CKD, retinopathy, neuropathy  Last Eye Exam: Completed 02/2021  Macrovascular complications:   Denies: CAD, CVA, PVD   HISTORY:  Past Medical History:  Past Medical History:  Diagnosis Date   Allergy    Diabetes mellitus without complication (HCC)    GERD (gastroesophageal reflux disease)    Hyperlipidemia    Thyroid disease     Past Surgical History:  Past Surgical History:  Procedure Laterality Date   FOOT SURGERY     REDUCTION MAMMAPLASTY     SHOULDER SURGERY     Social History:  reports that she has quit smoking. She has never used smokeless tobacco. She reports that she does not drink alcohol and does not use drugs. Family History:  Family History  Problem Relation Age of Onset   Colon cancer Neg Hx    Colon polyps Neg Hx    Esophageal cancer Neg Hx    Rectal cancer Neg Hx    Stomach cancer Neg Hx    Breast cancer Neg Hx      HOME MEDICATIONS: Allergies as of 06/18/2022   No Known Allergies      Medication List        Accurate as of Jun 18, 2022  6:54 AM. If you have any questions, ask your nurse or doctor.          blood glucose meter kit and supplies Dispense based on patient and insurance preference. Use up to four times daily as directed. (FOR ICD-10 E10.9, E11.9).   levothyroxine 150 MCG tablet Commonly known as: SYNTHROID Take 1 tablet (150 mcg total) by mouth as directed. Half a tablet on Sundays, 1 tablet rest of the week   metFORMIN 500 MG 24 hr tablet Commonly known as: GLUCOPHAGE-XR Take 2 tablets (1,000 mg total) by mouth daily with breakfast.   OneTouch Verio Flex System w/Device Kit Use as instructed to check  blood sugar up 2 times daily   OneTouch Verio test strip Generic drug: glucose blood Use as instructed to check blood sugar up 2 times daily   pantoprazole 40 MG tablet Commonly known as: PROTONIX TAKE 1 TABLET(40 MG) BY MOUTH DAILY   rosuvastatin 10 MG tablet Commonly known as: CRESTOR TAKE 1 TABLET(10 MG) BY MOUTH DAILY. Schedule fasting physical for further refills.   traZODone 50 MG tablet Commonly known as: DESYREL Take 0.5-1 tablets (25-50 mg total) by mouth at bedtime as needed for sleep.         OBJECTIVE:   Vital Signs: There were no vitals taken for this visit.  Wt Readings from Last 3 Encounters:  12/18/21 202 lb (91.6 kg)   08/22/21 202 lb (91.6 kg)  07/12/21 204 lb (92.5 kg)     Exam: General: Pt appears well and is in NAD  Neck: General: Supple without adenopathy. Thyroid: Thyroid size normal.  No goiter or nodules appreciated.  Lungs: Clear with good BS bilat with no rales, rhonchi, or wheezes  Heart: RRR   Abdomen: Normoactive bowel sounds, soft, nontender, without masses or organomegaly palpable  Extremities: No pretibial edema.  Neuro: MS is good with appropriate affect, pt is alert and Ox3    DM foot exam: 06/19/2021  The skin of the feet is intact without sores or ulcerations. The pedal pulses are 2+ on right and 2+ on left. The sensation is intact to a screening 5.07, 10 gram monofilament bilaterally     DATA REVIEWED:  Lab Results  Component Value Date   HGBA1C 6.4 (A) 12/18/2021   HGBA1C 6.7 (A) 06/19/2021   HGBA1C 6.7 (A) 11/29/2020    Latest Reference Range & Units 12/18/21 08:10  TSH 0.35 - 5.50 uIU/mL 0.19 (L)      Latest Reference Range & Units 06/19/21 07:37  Sodium 135 - 145 mEq/L 139  Potassium 3.5 - 5.1 mEq/L 4.1  Chloride 96 - 112 mEq/L 104  CO2 19 - 32 mEq/L 28  Glucose 70 - 99 mg/dL 161 (H)  BUN 6 - 23 mg/dL 14  Creatinine 0.96 - 0.45 mg/dL 4.09  Calcium 8.4 - 81.1 mg/dL 9.3  GFR >91.47 mL/min 68.70  Total CHOL/HDL Ratio  3  Cholesterol 0 - 200 mg/dL 829  HDL Cholesterol >56.21 mg/dL 30.86  LDL (calc) 0 - 99 mg/dL 86  MICROALB/CREAT RATIO 0.0 - 30.0 mg/g 2.4  NonHDL  106.45  Triglycerides 0.0 - 149.0 mg/dL 578.4  VLDL 0.0 - 69.6 mg/dL 29.5     Latest Reference Range & Units 06/19/21 07:37  Creatinine,U mg/dL 284.1  Microalb, Ur 0.0 - 1.9 mg/dL 4.5 (H)  MICROALB/CREAT RATIO 0.0 - 30.0 mg/g 2.4     ASSESSMENT / PLAN / RECOMMENDATIONS:   1) Type 2 Diabetes Mellitus, optimally controlled, With out complications - Most recent A1c of 6.4 %. Goal A1c <7.0%.  - Praised the pt on improved glycemic control  - NO changes    MEDICATIONS: Metformin 500  mg XR 2 tabs daily  EDUCATION / INSTRUCTIONS: BG monitoring instructions: Patient is instructed to check her blood sugars 1 times a day, fasting.   2) Diabetic complications:  Eye: Does not have known diabetic retinopathy.  Neuro/ Feet: Does not have known diabetic peripheral neuropathy .  Renal: Patient does not have known baseline CKD. She   is not on an ACEI/ARB at present.    3) Hashimoto's thyroiditis:  - TSH is low on levothyroxine 150 mcg daily, interestingly  enough her TSH was elevated >5 uIU/mL on levothyroxine 137 mcg daily, not sure if there was an imperfect adherence with levothyroxine in the past. -We will reduce levothyroxine as below     Medication Decrease levothyroxine 150 mcg , half a tablet on Sundays, and 1 tablet rest of the week  F/U in 6 months   Signed electronically by: Lyndle Herrlich, MD  [facility]  [facility] [address] [place] [place], [place] [address] Phone: [phone] FAX: [phone]   CC: Avanell Shackleton, NP-C [address] [place] [address] Phone: [phone]  Fax: [phone]  Return to Endocrinology clinic as below: Future Appointments  Date Time Provider Department Center  06/18/2022  8:10 AM Breawna Montenegro, Konrad Dolores, MD [facility] None

## 2022-06-27 ENCOUNTER — Other Ambulatory Visit: Payer: Self-pay | Admitting: Family Medicine

## 2022-06-27 DIAGNOSIS — E782 Mixed hyperlipidemia: Secondary | ICD-10-CM

## 2022-09-02 ENCOUNTER — Other Ambulatory Visit: Payer: Self-pay | Admitting: Family Medicine

## 2022-09-02 DIAGNOSIS — E782 Mixed hyperlipidemia: Secondary | ICD-10-CM

## 2022-09-18 ENCOUNTER — Other Ambulatory Visit: Payer: Self-pay | Admitting: Family Medicine

## 2022-09-18 DIAGNOSIS — E782 Mixed hyperlipidemia: Secondary | ICD-10-CM

## 2023-05-21 ENCOUNTER — Other Ambulatory Visit: Payer: Self-pay | Admitting: Emergency Medicine

## 2023-05-21 DIAGNOSIS — Z1231 Encounter for screening mammogram for malignant neoplasm of breast: Secondary | ICD-10-CM

## 2023-05-23 ENCOUNTER — Ambulatory Visit
Admission: RE | Admit: 2023-05-23 | Discharge: 2023-05-23 | Disposition: A | Discharge status: Home / Self Care | Source: Ambulatory Visit | Attending: Emergency Medicine | Admitting: Emergency Medicine

## 2023-05-23 DIAGNOSIS — Z1231 Encounter for screening mammogram for malignant neoplasm of breast: Secondary | ICD-10-CM

## 2023-06-05 ENCOUNTER — Encounter: Payer: Self-pay | Admitting: Radiology

## 2023-07-01 ENCOUNTER — Ambulatory Visit (HOSPITAL_COMMUNITY)
Admission: EM | Admit: 2023-07-01 | Discharge: 2023-07-01 | Disposition: A | Discharge status: Home / Self Care | Attending: Nurse Practitioner | Admitting: Nurse Practitioner

## 2023-07-01 ENCOUNTER — Encounter (HOSPITAL_COMMUNITY): Payer: Self-pay

## 2023-07-01 DIAGNOSIS — S39012A Strain of muscle, fascia and tendon of lower back, initial encounter: Secondary | ICD-10-CM

## 2023-07-01 DIAGNOSIS — S161XXA Strain of muscle, fascia and tendon at neck level, initial encounter: Secondary | ICD-10-CM

## 2023-07-01 MED ORDER — CYCLOBENZAPRINE HCL 10 MG PO TABS: 10.0000 mg | ORAL_TABLET | Freq: Every day | ORAL | 0 refills | Status: AC

## 2023-07-01 MED ORDER — NAPROXEN 500 MG PO TABS: 500.0000 mg | ORAL_TABLET | Freq: Two times a day (BID) | ORAL | 0 refills | Status: AC

## 2023-07-01 MED ORDER — KETOROLAC TROMETHAMINE 60 MG/2ML IM SOLN
60.0000 mg | Freq: Once | INTRAMUSCULAR | Status: AC
  Administered 2023-07-01: 60 mg via INTRAMUSCULAR

## 2023-07-01 MED ORDER — DEXAMETHASONE SODIUM PHOSPHATE 10 MG/ML IJ SOLN
10.0000 mg | Freq: Once | INTRAMUSCULAR | Status: AC
  Administered 2023-07-01: 10 mg via INTRAMUSCULAR

## 2023-07-01 MED ORDER — DEXAMETHASONE SODIUM PHOSPHATE 10 MG/ML IJ SOLN
INTRAMUSCULAR | Status: AC
  Filled 2023-07-01: qty 1

## 2023-07-01 MED ORDER — KETOROLAC TROMETHAMINE 60 MG/2ML IM SOLN
INTRAMUSCULAR | Status: AC
  Filled 2023-07-01: qty 2

## 2023-07-01 MED ORDER — METHOCARBAMOL 500 MG PO TABS: 500.0000 mg | ORAL_TABLET | Freq: Two times a day (BID) | ORAL | 0 refills | Status: AC
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
## 2023-11-20 ENCOUNTER — Other Ambulatory Visit (HOSPITAL_BASED_OUTPATIENT_CLINIC_OR_DEPARTMENT_OTHER): Payer: Self-pay

## 2023-11-20 MED ORDER — COMIRNATY 30 MCG/0.3ML IM SUSY
0.3000 mL | PREFILLED_SYRINGE | Freq: Once | INTRAMUSCULAR | 0 refills | Status: AC
  Filled 2023-11-20: qty 0.3, 1d supply, fill #0

## 2024-02-13 IMAGING — MG MM DIGITAL SCREENING BILAT W/ TOMO AND CAD
6 of 10 series · 6 of 30 positions shown · non-contrast
Comparison: Previous exam(s).

CLINICAL DATA: Screening.

EXAM:
DIGITAL SCREENING BILATERAL MAMMOGRAM WITH TOMOSYNTHESIS AND CAD
TECHNIQUE: Bilateral screening digital craniocaudal and mediolateral oblique
mammograms were obtained. Bilateral screening digital breast
tomosynthesis was performed. The images were evaluated with
computer-aided detection.

[R MLO synth-2D (1 of 2)]
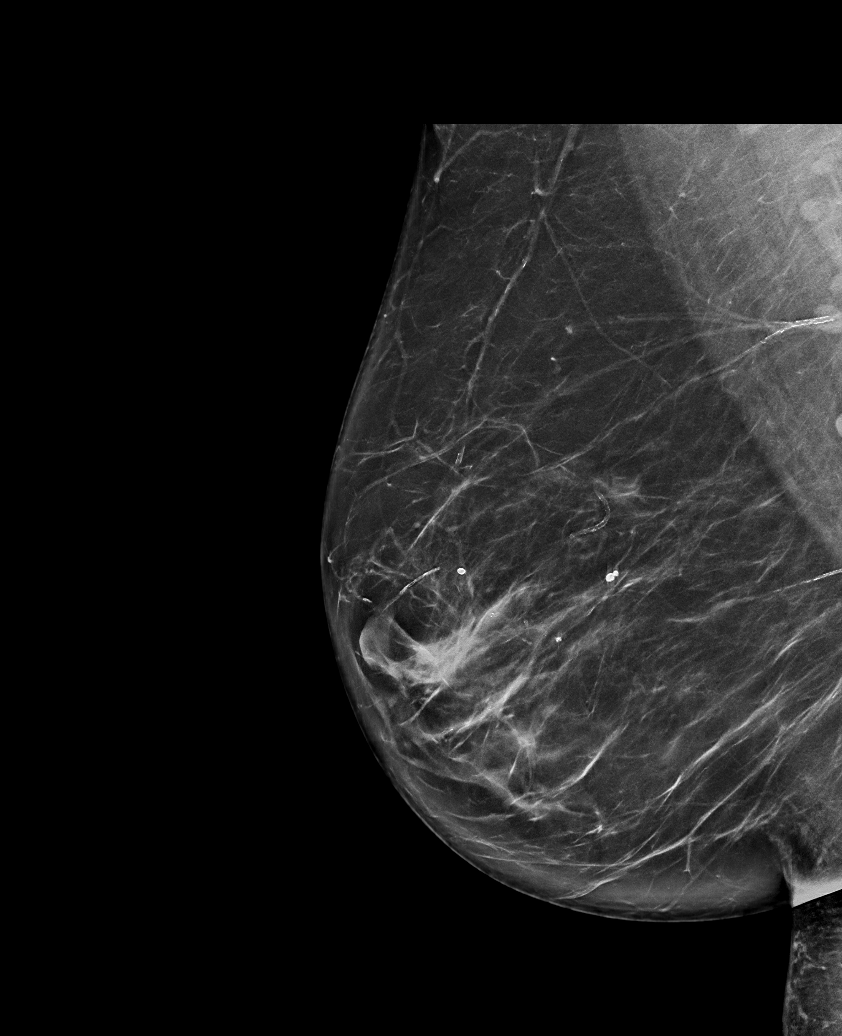

[R MLO synth-2D (2 of 2)]
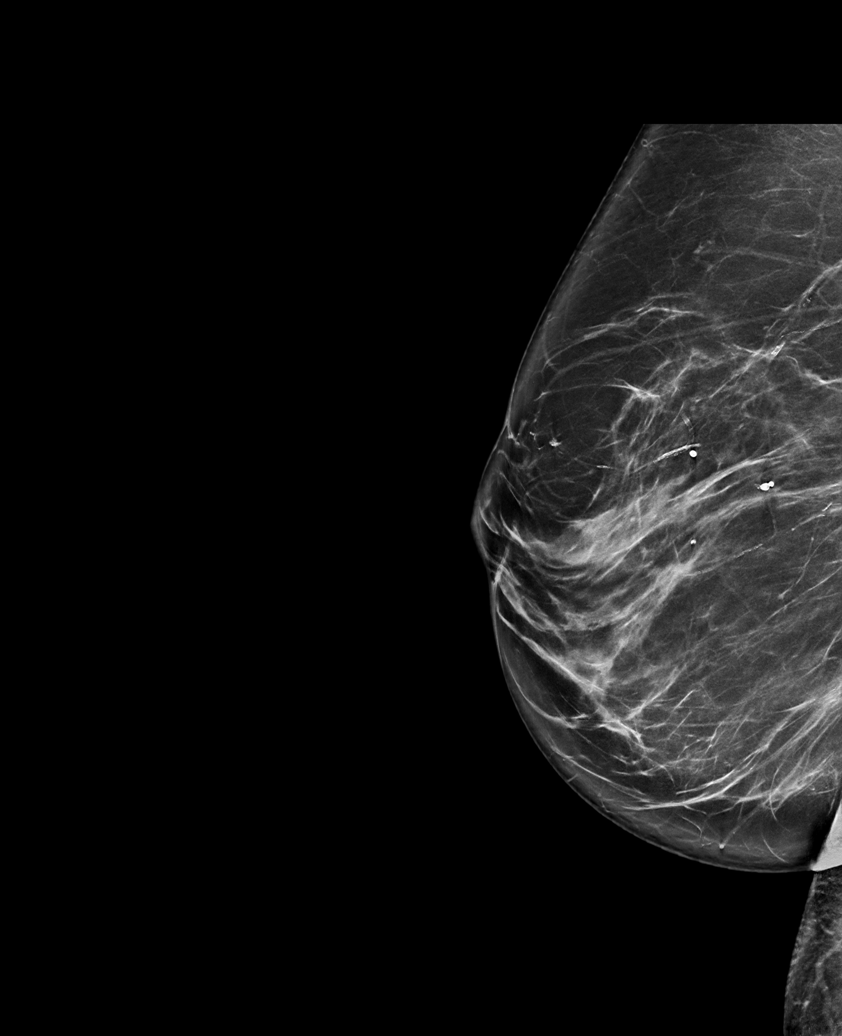

[R CC synth-2D]
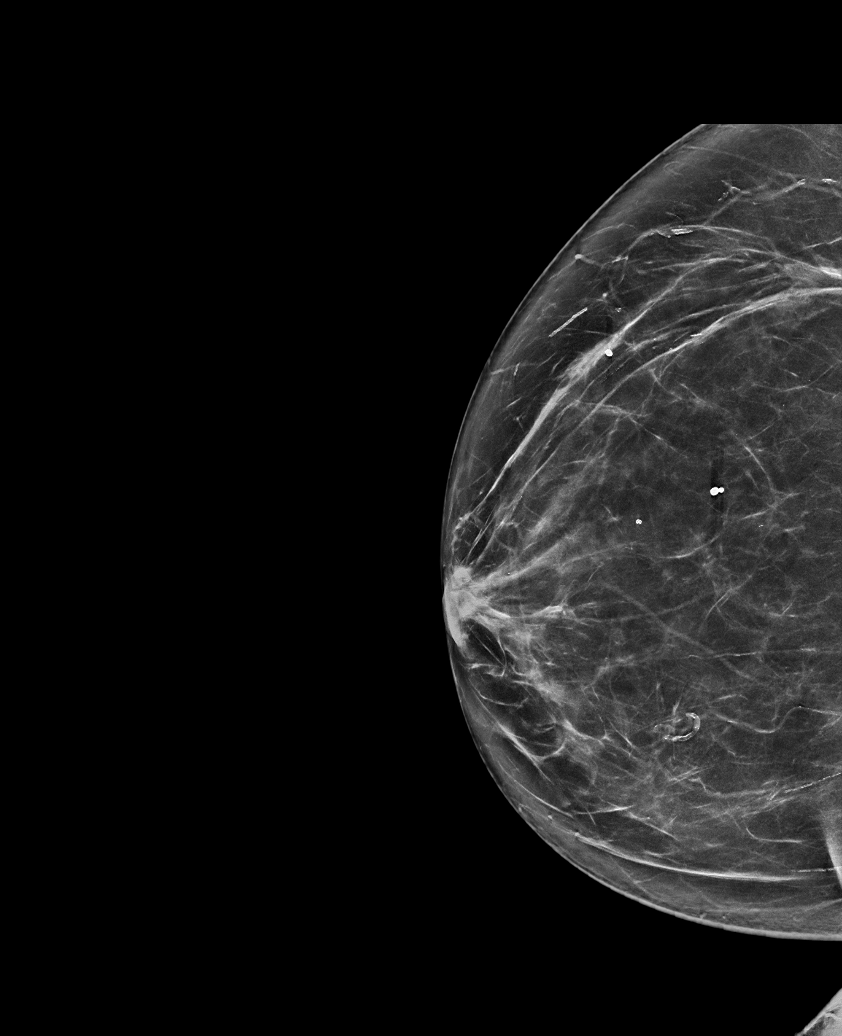

[L CC synth-2D]
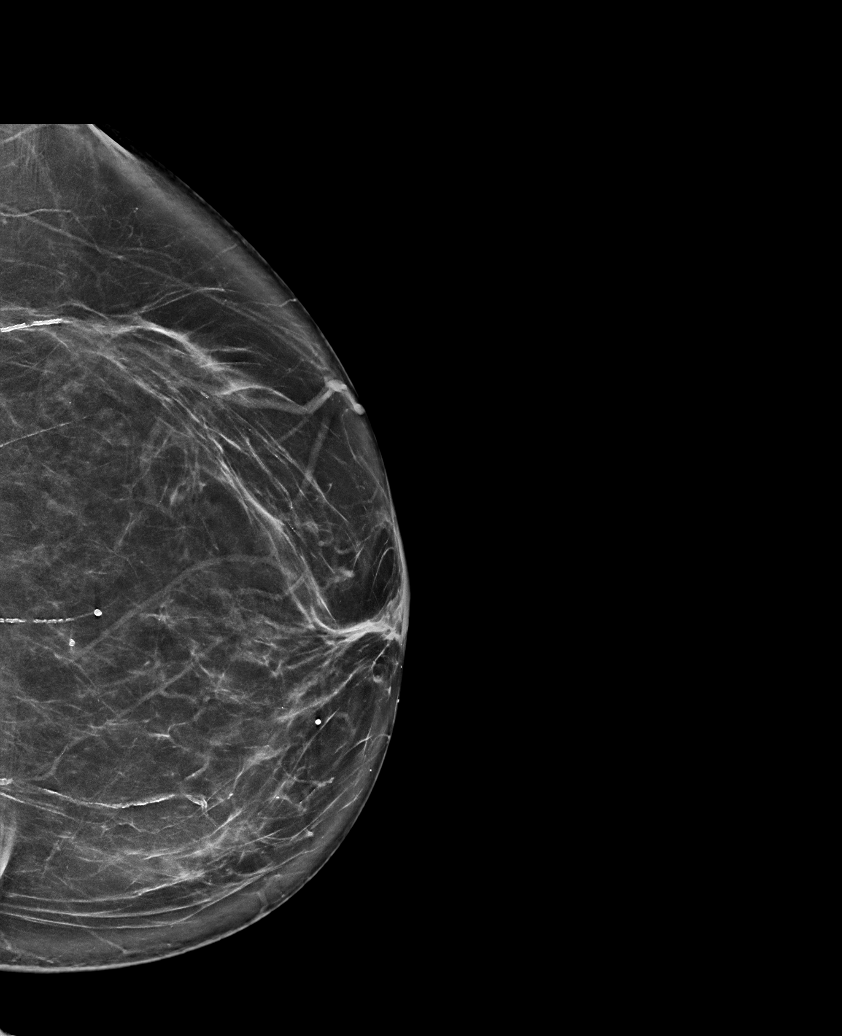

[L MLO synth-2D]
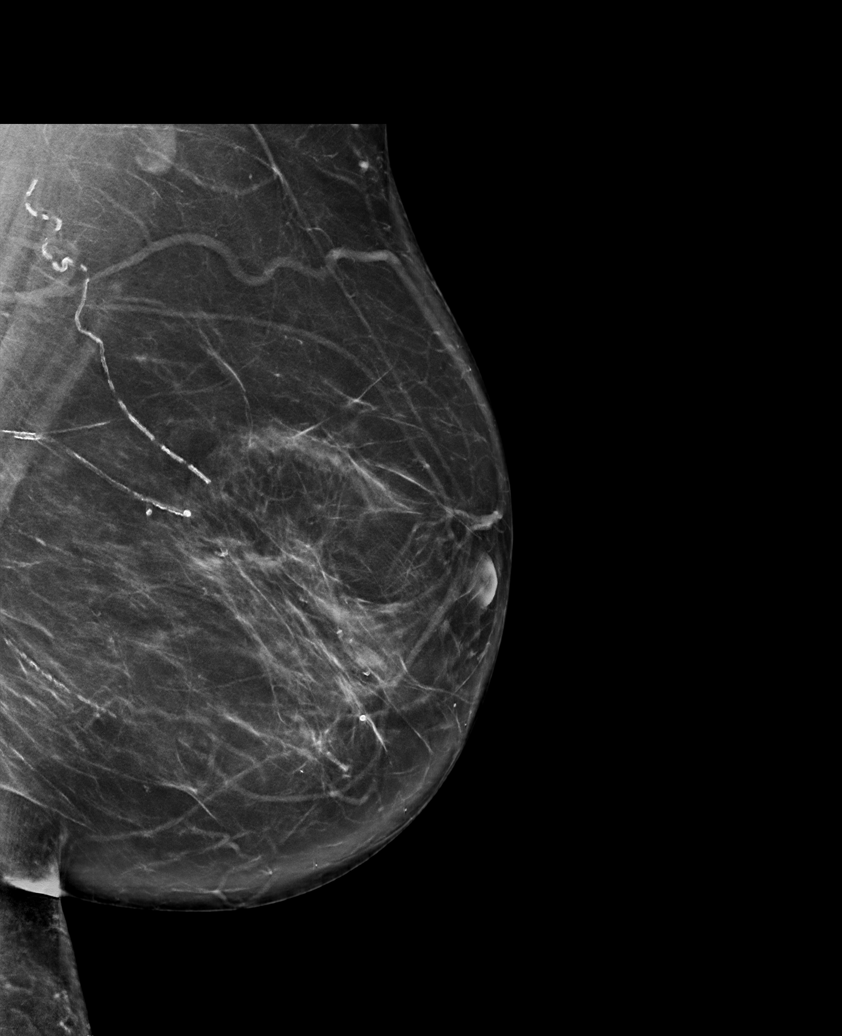

[R MLO tomo · tomo slice 47/94.0]
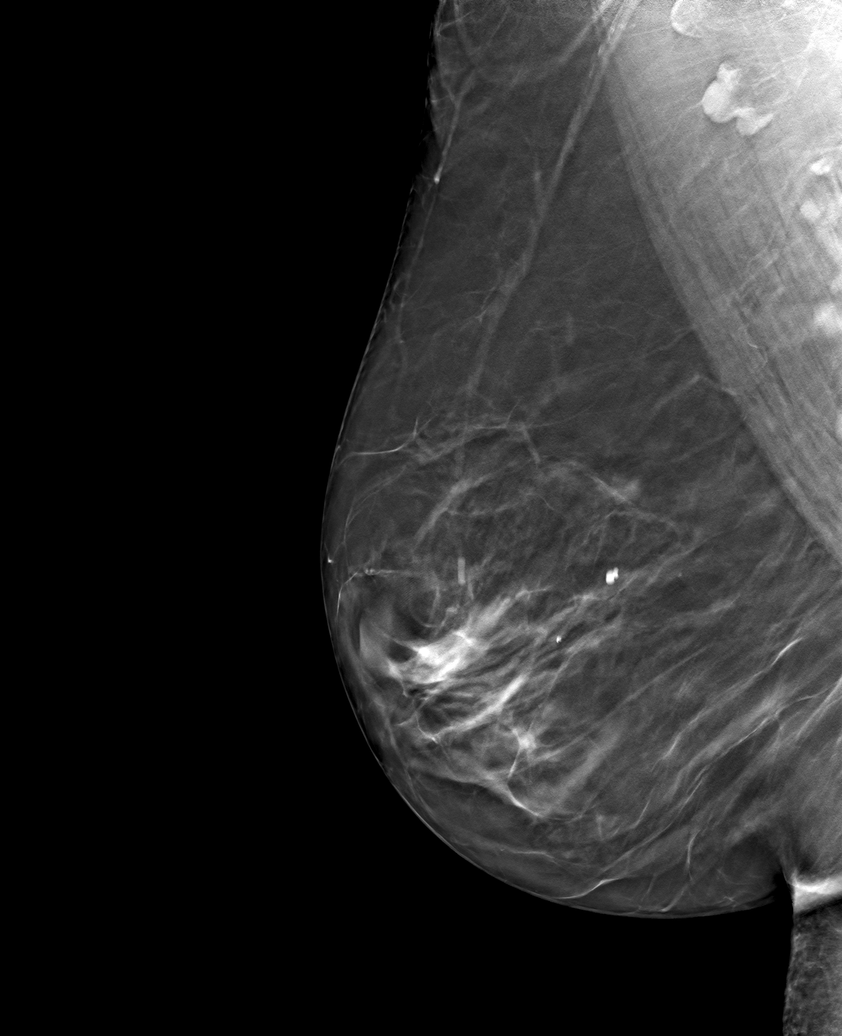

[6 of 30 positions shown; findings below may reference images not displayed]

ACR Breast Density Category b: There are scattered areas of
fibroglandular density.
FINDINGS: There are no findings suspicious for malignancy.
IMPRESSION: No mammographic evidence of malignancy. A result letter of this
screening mammogram will be mailed directly to the patient.

RECOMMENDATION:
Screening mammogram in one year. (Code:51-O-LD2)

BI-RADS CATEGORY  1: Negative.
